# Patient Record
Sex: Male | Born: 1935 | Race: White | Hispanic: No | Marital: Married | Smoking: Never smoker
Health system: Southern US, Community
[De-identification: ages and names within clinical notes are randomized; demographics above are authoritative.]

## PROBLEM LIST (undated history)

## (undated) DIAGNOSIS — K759 Inflammatory liver disease, unspecified: Secondary | ICD-10-CM

## (undated) DIAGNOSIS — E039 Hypothyroidism, unspecified: Secondary | ICD-10-CM

## (undated) DIAGNOSIS — E785 Hyperlipidemia, unspecified: Secondary | ICD-10-CM

## (undated) DIAGNOSIS — E119 Type 2 diabetes mellitus without complications: Secondary | ICD-10-CM

## (undated) DIAGNOSIS — G459 Transient cerebral ischemic attack, unspecified: Secondary | ICD-10-CM

## (undated) DIAGNOSIS — K219 Gastro-esophageal reflux disease without esophagitis: Secondary | ICD-10-CM

## (undated) DIAGNOSIS — K635 Polyp of colon: Secondary | ICD-10-CM

## (undated) DIAGNOSIS — I10 Essential (primary) hypertension: Secondary | ICD-10-CM

## (undated) DIAGNOSIS — I6529 Occlusion and stenosis of unspecified carotid artery: Secondary | ICD-10-CM

## (undated) DIAGNOSIS — M1990 Unspecified osteoarthritis, unspecified site: Secondary | ICD-10-CM

## (undated) HISTORY — DX: Type 2 diabetes mellitus without complications: E11.9

## (undated) HISTORY — DX: Unspecified osteoarthritis, unspecified site: M19.90

## (undated) HISTORY — DX: Hypothyroidism, unspecified: E03.9

## (undated) HISTORY — DX: Gastro-esophageal reflux disease without esophagitis: K21.9

## (undated) HISTORY — DX: Essential (primary) hypertension: I10

## (undated) HISTORY — DX: Occlusion and stenosis of unspecified carotid artery: I65.29

## (undated) HISTORY — DX: Polyp of colon: K63.5

## (undated) HISTORY — DX: Transient cerebral ischemic attack, unspecified: G45.9

## (undated) HISTORY — DX: Hyperlipidemia, unspecified: E78.5

---

## 1978-08-03 DIAGNOSIS — G459 Transient cerebral ischemic attack, unspecified: Secondary | ICD-10-CM

## 1978-08-03 HISTORY — DX: Transient cerebral ischemic attack, unspecified: G45.9

## 1998-02-18 ENCOUNTER — Ambulatory Visit (HOSPITAL_COMMUNITY): Admission: RE | Admit: 1998-02-18 | Discharge: 1998-02-19 | Payer: Self-pay | Admitting: Surgery

## 1999-12-03 ENCOUNTER — Encounter: Payer: Self-pay | Admitting: Emergency Medicine

## 1999-12-03 ENCOUNTER — Emergency Department (HOSPITAL_COMMUNITY): Admission: EM | Admit: 1999-12-03 | Discharge: 1999-12-03 | Payer: Self-pay | Admitting: Emergency Medicine

## 1999-12-07 ENCOUNTER — Emergency Department (HOSPITAL_COMMUNITY): Admission: EM | Admit: 1999-12-07 | Discharge: 1999-12-07 | Payer: Self-pay | Admitting: Emergency Medicine

## 2004-10-15 ENCOUNTER — Ambulatory Visit: Payer: Self-pay | Admitting: Internal Medicine

## 2004-11-04 ENCOUNTER — Ambulatory Visit: Payer: Self-pay | Admitting: Internal Medicine

## 2006-08-03 HISTORY — PX: THYROIDECTOMY: SHX17

## 2009-10-07 ENCOUNTER — Encounter (INDEPENDENT_AMBULATORY_CARE_PROVIDER_SITE_OTHER): Payer: Self-pay | Admitting: *Deleted

## 2010-01-20 ENCOUNTER — Encounter (INDEPENDENT_AMBULATORY_CARE_PROVIDER_SITE_OTHER): Payer: Self-pay | Admitting: *Deleted

## 2010-01-23 ENCOUNTER — Ambulatory Visit: Payer: Self-pay | Admitting: Internal Medicine

## 2010-02-06 ENCOUNTER — Ambulatory Visit: Payer: Self-pay | Admitting: Internal Medicine

## 2010-02-07 ENCOUNTER — Encounter: Payer: Self-pay | Admitting: Internal Medicine

## 2010-09-04 NOTE — Miscellaneous (Signed)
Summary: [facility] Previsit/prep  Clinical Lists Changes  Medications: Added new medication of MOVIPREP 100 GM  SOLR (PEG-KCL-NACL-NASULF-NA ASC-C) As per prep instructions. - Signed Rx of MOVIPREP 100 GM  SOLR (PEG-KCL-NACL-NASULF-NA ASC-C) As per prep instructions.;  #1 x 0;  Signed;  Entered by: Wyona Almas RN;  Authorized by: Hilarie Fredrickson MD;  Method used: Electronically to [facility] #[id number]*, [address] [place], [place], [place]  [address], Ph: [phone], Fax: [phone] Observations: Added new observation of NKA: T (01/23/2010 10:52)    Prescriptions: MOVIPREP 100 GM  SOLR (PEG-KCL-NACL-NASULF-NA ASC-C) As per prep instructions.  #1 x 0   Entered by:   Wyona Almas RN   Authorized by:   Hilarie Fredrickson MD   Signed by:   Wyona Almas RN on 01/23/2010   Method used:   Electronically to        [facility]. [phone]* (retail)       [address]       [place], [place]  [address]       Ph: [phone]       Fax: [phone]   RxID:   [id number]

## 2010-09-04 NOTE — Letter (Signed)
Summary: Colonoscopy Letter  [facility]  [address] [place], [place] [address]   Phone: [phone]  Fax: [phone]      October 07, 2009 MRN: [id number]   [facility] [place] [address] [place], [place]  [address]   Dear Mr. Joerger,   According to your medical record, it is time for you to schedule a Colonoscopy. The American Cancer Society recommends this procedure as a method to detect early colon cancer. Patients with a family history of colon cancer, or a personal history of colon polyps or inflammatory bowel disease are at increased risk.  This letter has been generated based on the recommendations made at the time of your procedure. If you feel that in your particular situation this may no longer apply, please contact our office.  Please call our office at [phone] to schedule this appointment or to update your records at your earliest convenience.  Thank you for cooperating with [place] to provide you with the very best care possible.   Sincerely,  Wilhemina Bonito. Marina Goodell, M.D.  [facility] [phone]

## 2010-09-04 NOTE — Letter (Signed)
Summary: [facility] Instructions  [facility]  [address] [place], [place] [address]   Phone: [phone]  Fax: [phone]       Nathaniel Mcclain    12/30/1935    MRN: [id number]        Procedure Day /Date: 02/06/10 Thursday       Arrival Time: 8:00 am      Procedure Time: 9:00 am     Location of Procedure:                      [facility] (4th Floor)                        PREPARATION FOR COLONOSCOPY WITH MOVIPREP   Starting 5 days prior to your procedure  02/01/2010  do not eat nuts, seeds, popcorn, corn, beans, peas,  salads, or any raw vegetables.  Do not take any fiber supplements (e.g. Metamucil, Citrucel, and Benefiber).  THE DAY BEFORE YOUR PROCEDURE         DATE:   02/05/2010   DAY:  Wednesday  1.  Drink clear liquids the entire day-NO SOLID FOOD  2.  Do not drink anything colored red or purple.  Avoid juices with pulp.  No orange juice.  3.  Drink at least 64 oz. (8 glasses) of fluid/clear liquids during the day to prevent dehydration and help the prep work efficiently.  CLEAR LIQUIDS INCLUDE: Water Jello Ice Popsicles Tea (sugar ok, no milk/cream) Powdered fruit flavored drinks Coffee (sugar ok, no milk/cream) Gatorade Juice: apple, white grape, white cranberry  Lemonade Clear bullion, consomm, broth Carbonated beverages (any kind) Strained chicken noodle soup Hard Candy                             4.  In the morning, mix first dose of MoviPrep solution:    Empty 1 Pouch A and 1 Pouch B into the disposable container    Add lukewarm drinking water to the top line of the container. Mix to dissolve    Refrigerate (mixed solution should be used within 24 hrs)  5.  Begin drinking the prep at 5:00 p.m. The MoviPrep container is divided by 4 marks.   Every 15 minutes drink the solution down to the next mark (approximately 8 oz) until the full liter is complete.   6.  Follow completed prep with 16 oz of clear liquid of your choice  (Nothing red or purple).  Continue to drink clear liquids until bedtime.  7.  Before going to bed, mix second dose of MoviPrep solution:    Empty 1 Pouch A and 1 Pouch B into the disposable container    Add lukewarm drinking water to the top line of the container. Mix to dissolve    Refrigerate  THE DAY OF YOUR PROCEDURE      DATE:   02/06/2010  DAY:  Thursday  Beginning at  4:00 a.m. (5 hours before procedure):         1. Every 15 minutes, drink the solution down to the next mark (approx 8 oz) until the full liter is complete.  2. Follow completed prep with 16 oz. of clear liquid of your choice.    3. You may drink clear liquids until   7:00 am (2 HOURS BEFORE PROCEDURE).   MEDICATION INSTRUCTIONS  Unless otherwise instructed, you should take regular prescription medications  with a small sip of water   as early as possible the morning of your procedure.          OTHER INSTRUCTIONS  You will need a responsible adult at least 73 years of age to accompany you and drive you home.   This person must remain in the waiting room during your procedure.  Wear loose fitting clothing that is easily removed.  Leave jewelry and other valuables at home.  However, you may wish to bring a book to read or  an iPod/MP3 player to listen to music as you wait for your procedure to start.  Remove all body piercing jewelry and leave at home.  Total time from sign-in until discharge is approximately 2-3 hours.  You should go home directly after your procedure and rest.  You can resume normal activities the  day after your procedure.  The day of your procedure you should not:   Drive   Make legal decisions   Operate machinery   Drink alcohol   Return to work  You will receive specific instructions about eating, activities and medications before you leave.    The above instructions have been reviewed and explained to me by   Wyona Almas RN  January 23, 2010 11:39 AM     I  fully understand and can verbalize these instructions _____________________________ Date _________

## 2010-09-04 NOTE — Procedures (Signed)
Summary: Colonoscopy  Patient: Nathaniel Mcclain Note: All result statuses are Final unless otherwise noted.  Tests: (1) Colonoscopy (COL)   COL Colonoscopy           DONE     [facility]     [address]     [place], [place]  [address]           COLONOSCOPY PROCEDURE REPORT           PATIENT:  Nathaniel Mcclain, Nathaniel Mcclain  MR#:  [id number]     BIRTHDATE:  [birth year], 74 yrs. old  GENDER:  male     ENDOSCOPIST:  Wilhemina Bonito. Eda Keys, MD     REF. BY:  Surveillance Program Recall,     PROCEDURE DATE:  02/06/2010     PROCEDURE:  Colonoscopy with snare polypectomy x 2     ASA CLASS:  Class II     INDICATIONS:  history of pre-cancerous (adenomatous) colon polyps,     surveillance and high-risk screening ; index 2002 w/ hp and fair     prep; 2006 w/ TA     MEDICATIONS:   Fentanyl 75 mcg IV, Versed 7 mg IV           DESCRIPTION OF PROCEDURE:   After the risks benefits and     alternatives of the procedure were thoroughly explained, informed     consent was obtained.  Digital rectal exam was performed and     revealed no abnormalities.   The Pentax Ped Colon [id number]     endoscope was introduced through the anus and advanced to the     cecum, which was identified by both the appendix and ileocecal     valve, without limitations.time to cecum =5:23 min.  The quality     of the prep was excellent, using MoviPrep.  The instrument was     then slowly withdrawn (time = 14:01 min) as the colon was fully     examined.     <<PROCEDUREIMAGES>>           FINDINGS:  Two polyps were found - 2mm in the transverse colon and     4mm in rectum. Polyps were snared without cautery. Retrieval was     successful.   Severe diverticulosis was found throughout the colon     w/ significant sigmoid stenosis..   Retroflexed views in the     rectum revealed internal hemorrhoids.    The scope was then     withdrawn from the patient and the procedure completed.           COMPLICATIONS:  None     ENDOSCOPIC IMPRESSION:     1)  Two polyps - removed     2) Severe diverticulosis throughout the colon with sigmoid     stenosis     3) Internal hemorrhoids           RECOMMENDATIONS:     1) Follow up colonoscopy in 5 years           ______________________________     Wilhemina Bonito. Eda Keys, MD           CC:  Geoffry Paradise, MD; The Patient           n.     eSIGNED:   Wilhemina Bonito. Eda Keys at 02/06/2010 09:57 AM           [place]ne, Peyton Najjar, [id number]  Note: An exclamation mark (!) indicates a result that was  not dispersed into the flowsheet. Document Creation Date: 02/06/2010 9:58 AM _______________________________________________________________________  (1) Order result status: Final Collection or observation date-time: 02/06/2010 09:50 Requested date-time:  Receipt date-time:  Reported date-time:  Referring Physician:   Ordering Physician: Fransico Setters [phone]) Specimen Source:  Source: Launa Grill Order Number: [id number] Lab site:   Appended Document: Colonoscopy recall 5 yrs/02-2015/aw     Procedures Next Due Date:    Colonoscopy: 02/2015

## 2010-09-04 NOTE — Letter (Signed)
Summary: Patient Notice- Polyp Results  [name]  [address] [place], [place] [address]   Phone: [phone]  Fax: [phone]        February 07, 2010 MRN: [id number]    [facility] [place] [address] [place], [place]  [address]    Dear Nathaniel Mcclain,  I am pleased to inform you that the colon polyp(s) removed during your recent colonoscopy was (were) found to be benign (no cancer detected) upon pathologic examination.  I recommend you have a repeat colonoscopy examination in 5 years to look for recurrent polyps, as having colon polyps increases your risk for having recurrent polyps or even colon cancer in the future.  Should you develop new or worsening symptoms of abdominal pain, bowel habit changes or bleeding from the rectum or bowels, please schedule an evaluation with either your primary care physician or with me.   Additional information/recommendations:  __ No further action with gastroenterology is needed at this time. Please      follow-up with your primary care physician for your other healthcare      needs.   Please call us if you are having persistent problems or have questions about your condition that have not been fully answered at this time.  Sincerely,  Hilarie Fredrickson MD  This letter has been electronically signed by your physician.  Appended Document: Patient Notice- Polyp Results letter mailed

## 2011-12-11 DIAGNOSIS — E785 Hyperlipidemia, unspecified: Secondary | ICD-10-CM | POA: Diagnosis not present

## 2011-12-11 DIAGNOSIS — E039 Hypothyroidism, unspecified: Secondary | ICD-10-CM | POA: Diagnosis not present

## 2011-12-11 DIAGNOSIS — R82998 Other abnormal findings in urine: Secondary | ICD-10-CM | POA: Diagnosis not present

## 2011-12-11 DIAGNOSIS — Z125 Encounter for screening for malignant neoplasm of prostate: Secondary | ICD-10-CM | POA: Diagnosis not present

## 2011-12-11 DIAGNOSIS — I10 Essential (primary) hypertension: Secondary | ICD-10-CM | POA: Diagnosis not present

## 2011-12-18 DIAGNOSIS — Z0000 Encounter for general adult medical examination without abnormal findings: Secondary | ICD-10-CM | POA: Diagnosis not present

## 2011-12-18 DIAGNOSIS — I10 Essential (primary) hypertension: Secondary | ICD-10-CM | POA: Diagnosis not present

## 2011-12-18 DIAGNOSIS — M1990 Unspecified osteoarthritis, unspecified site: Secondary | ICD-10-CM | POA: Diagnosis not present

## 2011-12-18 DIAGNOSIS — E039 Hypothyroidism, unspecified: Secondary | ICD-10-CM | POA: Diagnosis not present

## 2011-12-18 DIAGNOSIS — Z125 Encounter for screening for malignant neoplasm of prostate: Secondary | ICD-10-CM | POA: Diagnosis not present

## 2011-12-21 DIAGNOSIS — Z1212 Encounter for screening for malignant neoplasm of rectum: Secondary | ICD-10-CM | POA: Diagnosis not present

## 2012-06-29 DIAGNOSIS — I10 Essential (primary) hypertension: Secondary | ICD-10-CM | POA: Diagnosis not present

## 2012-06-29 DIAGNOSIS — E785 Hyperlipidemia, unspecified: Secondary | ICD-10-CM | POA: Diagnosis not present

## 2012-06-29 DIAGNOSIS — Z23 Encounter for immunization: Secondary | ICD-10-CM | POA: Diagnosis not present

## 2012-06-29 DIAGNOSIS — E039 Hypothyroidism, unspecified: Secondary | ICD-10-CM | POA: Diagnosis not present

## 2012-12-12 DIAGNOSIS — H2510 Age-related nuclear cataract, unspecified eye: Secondary | ICD-10-CM | POA: Diagnosis not present

## 2012-12-12 DIAGNOSIS — H40019 Open angle with borderline findings, low risk, unspecified eye: Secondary | ICD-10-CM | POA: Diagnosis not present

## 2012-12-12 DIAGNOSIS — T1500XA Foreign body in cornea, unspecified eye, initial encounter: Secondary | ICD-10-CM | POA: Diagnosis not present

## 2012-12-12 DIAGNOSIS — H01009 Unspecified blepharitis unspecified eye, unspecified eyelid: Secondary | ICD-10-CM | POA: Diagnosis not present

## 2012-12-19 DIAGNOSIS — E785 Hyperlipidemia, unspecified: Secondary | ICD-10-CM | POA: Diagnosis not present

## 2012-12-19 DIAGNOSIS — Z125 Encounter for screening for malignant neoplasm of prostate: Secondary | ICD-10-CM | POA: Diagnosis not present

## 2012-12-19 DIAGNOSIS — R82998 Other abnormal findings in urine: Secondary | ICD-10-CM | POA: Diagnosis not present

## 2012-12-19 DIAGNOSIS — I10 Essential (primary) hypertension: Secondary | ICD-10-CM | POA: Diagnosis not present

## 2012-12-19 DIAGNOSIS — E039 Hypothyroidism, unspecified: Secondary | ICD-10-CM | POA: Diagnosis not present

## 2013-01-04 DIAGNOSIS — Z1212 Encounter for screening for malignant neoplasm of rectum: Secondary | ICD-10-CM | POA: Diagnosis not present

## 2013-01-12 DIAGNOSIS — H2510 Age-related nuclear cataract, unspecified eye: Secondary | ICD-10-CM | POA: Diagnosis not present

## 2013-01-12 DIAGNOSIS — H40019 Open angle with borderline findings, low risk, unspecified eye: Secondary | ICD-10-CM | POA: Diagnosis not present

## 2013-01-12 DIAGNOSIS — H04129 Dry eye syndrome of unspecified lacrimal gland: Secondary | ICD-10-CM | POA: Diagnosis not present

## 2013-01-12 DIAGNOSIS — H02839 Dermatochalasis of unspecified eye, unspecified eyelid: Secondary | ICD-10-CM | POA: Diagnosis not present

## 2013-01-23 DIAGNOSIS — Z23 Encounter for immunization: Secondary | ICD-10-CM | POA: Diagnosis not present

## 2013-01-23 DIAGNOSIS — Z6825 Body mass index (BMI) 25.0-25.9, adult: Secondary | ICD-10-CM | POA: Diagnosis not present

## 2013-01-23 DIAGNOSIS — R011 Cardiac murmur, unspecified: Secondary | ICD-10-CM | POA: Diagnosis not present

## 2013-01-23 DIAGNOSIS — E039 Hypothyroidism, unspecified: Secondary | ICD-10-CM | POA: Diagnosis not present

## 2013-01-23 DIAGNOSIS — E785 Hyperlipidemia, unspecified: Secondary | ICD-10-CM | POA: Diagnosis not present

## 2013-01-23 DIAGNOSIS — Z1331 Encounter for screening for depression: Secondary | ICD-10-CM | POA: Diagnosis not present

## 2013-01-23 DIAGNOSIS — I10 Essential (primary) hypertension: Secondary | ICD-10-CM | POA: Diagnosis not present

## 2013-01-23 DIAGNOSIS — M1990 Unspecified osteoarthritis, unspecified site: Secondary | ICD-10-CM | POA: Diagnosis not present

## 2013-01-23 DIAGNOSIS — Z125 Encounter for screening for malignant neoplasm of prostate: Secondary | ICD-10-CM | POA: Diagnosis not present

## 2013-01-23 DIAGNOSIS — Z0000 Encounter for general adult medical examination without abnormal findings: Secondary | ICD-10-CM | POA: Diagnosis not present

## 2013-02-01 ENCOUNTER — Other Ambulatory Visit (INDEPENDENT_AMBULATORY_CARE_PROVIDER_SITE_OTHER): Payer: Medicare Other | Admitting: Vascular Surgery

## 2013-02-01 ENCOUNTER — Encounter: Payer: Self-pay | Admitting: Internal Medicine

## 2013-02-01 DIAGNOSIS — I10 Essential (primary) hypertension: Secondary | ICD-10-CM | POA: Diagnosis not present

## 2013-02-01 DIAGNOSIS — E785 Hyperlipidemia, unspecified: Secondary | ICD-10-CM | POA: Diagnosis not present

## 2013-02-01 DIAGNOSIS — E039 Hypothyroidism, unspecified: Secondary | ICD-10-CM | POA: Diagnosis not present

## 2013-02-01 DIAGNOSIS — R0989 Other specified symptoms and signs involving the circulatory and respiratory systems: Secondary | ICD-10-CM | POA: Diagnosis not present

## 2013-02-01 DIAGNOSIS — R011 Cardiac murmur, unspecified: Secondary | ICD-10-CM | POA: Diagnosis not present

## 2013-02-01 DIAGNOSIS — K219 Gastro-esophageal reflux disease without esophagitis: Secondary | ICD-10-CM | POA: Diagnosis not present

## 2013-02-01 DIAGNOSIS — I359 Nonrheumatic aortic valve disorder, unspecified: Secondary | ICD-10-CM | POA: Diagnosis not present

## 2013-02-01 DIAGNOSIS — Z8673 Personal history of transient ischemic attack (TIA), and cerebral infarction without residual deficits: Secondary | ICD-10-CM | POA: Diagnosis not present

## 2013-06-15 DIAGNOSIS — L819 Disorder of pigmentation, unspecified: Secondary | ICD-10-CM | POA: Diagnosis not present

## 2013-06-15 DIAGNOSIS — L821 Other seborrheic keratosis: Secondary | ICD-10-CM | POA: Diagnosis not present

## 2013-06-15 DIAGNOSIS — L570 Actinic keratosis: Secondary | ICD-10-CM | POA: Diagnosis not present

## 2013-06-15 DIAGNOSIS — D0460 Carcinoma in situ of skin of unspecified upper limb, including shoulder: Secondary | ICD-10-CM | POA: Diagnosis not present

## 2013-06-15 DIAGNOSIS — D485 Neoplasm of uncertain behavior of skin: Secondary | ICD-10-CM | POA: Diagnosis not present

## 2013-09-21 DIAGNOSIS — M1990 Unspecified osteoarthritis, unspecified site: Secondary | ICD-10-CM | POA: Diagnosis not present

## 2013-09-21 DIAGNOSIS — E785 Hyperlipidemia, unspecified: Secondary | ICD-10-CM | POA: Diagnosis not present

## 2013-09-21 DIAGNOSIS — E039 Hypothyroidism, unspecified: Secondary | ICD-10-CM | POA: Diagnosis not present

## 2013-09-21 DIAGNOSIS — I10 Essential (primary) hypertension: Secondary | ICD-10-CM | POA: Diagnosis not present

## 2013-09-21 DIAGNOSIS — Z6826 Body mass index (BMI) 26.0-26.9, adult: Secondary | ICD-10-CM | POA: Diagnosis not present

## 2013-12-13 DIAGNOSIS — D2370 Other benign neoplasm of skin of unspecified lower limb, including hip: Secondary | ICD-10-CM | POA: Diagnosis not present

## 2013-12-13 DIAGNOSIS — D1801 Hemangioma of skin and subcutaneous tissue: Secondary | ICD-10-CM | POA: Diagnosis not present

## 2013-12-13 DIAGNOSIS — Z85828 Personal history of other malignant neoplasm of skin: Secondary | ICD-10-CM | POA: Diagnosis not present

## 2013-12-13 DIAGNOSIS — L821 Other seborrheic keratosis: Secondary | ICD-10-CM | POA: Diagnosis not present

## 2013-12-13 DIAGNOSIS — D239 Other benign neoplasm of skin, unspecified: Secondary | ICD-10-CM | POA: Diagnosis not present

## 2013-12-13 DIAGNOSIS — L570 Actinic keratosis: Secondary | ICD-10-CM | POA: Diagnosis not present

## 2014-02-07 DIAGNOSIS — R82998 Other abnormal findings in urine: Secondary | ICD-10-CM | POA: Diagnosis not present

## 2014-02-07 DIAGNOSIS — E785 Hyperlipidemia, unspecified: Secondary | ICD-10-CM | POA: Diagnosis not present

## 2014-02-07 DIAGNOSIS — Z125 Encounter for screening for malignant neoplasm of prostate: Secondary | ICD-10-CM | POA: Diagnosis not present

## 2014-02-07 DIAGNOSIS — I10 Essential (primary) hypertension: Secondary | ICD-10-CM | POA: Diagnosis not present

## 2014-02-07 DIAGNOSIS — E039 Hypothyroidism, unspecified: Secondary | ICD-10-CM | POA: Diagnosis not present

## 2014-02-12 DIAGNOSIS — E785 Hyperlipidemia, unspecified: Secondary | ICD-10-CM | POA: Diagnosis not present

## 2014-02-12 DIAGNOSIS — M1990 Unspecified osteoarthritis, unspecified site: Secondary | ICD-10-CM | POA: Diagnosis not present

## 2014-02-12 DIAGNOSIS — Z125 Encounter for screening for malignant neoplasm of prostate: Secondary | ICD-10-CM | POA: Diagnosis not present

## 2014-02-12 DIAGNOSIS — I10 Essential (primary) hypertension: Secondary | ICD-10-CM | POA: Diagnosis not present

## 2014-02-12 DIAGNOSIS — Z0000 Encounter for general adult medical examination without abnormal findings: Secondary | ICD-10-CM | POA: Diagnosis not present

## 2014-02-12 DIAGNOSIS — Z1331 Encounter for screening for depression: Secondary | ICD-10-CM | POA: Diagnosis not present

## 2014-02-12 DIAGNOSIS — E039 Hypothyroidism, unspecified: Secondary | ICD-10-CM | POA: Diagnosis not present

## 2014-02-12 DIAGNOSIS — Z6826 Body mass index (BMI) 26.0-26.9, adult: Secondary | ICD-10-CM | POA: Diagnosis not present

## 2014-02-13 DIAGNOSIS — Z1212 Encounter for screening for malignant neoplasm of rectum: Secondary | ICD-10-CM | POA: Diagnosis not present

## 2014-05-14 ENCOUNTER — Encounter: Payer: Self-pay | Admitting: Internal Medicine

## 2014-05-18 DIAGNOSIS — Z23 Encounter for immunization: Secondary | ICD-10-CM | POA: Diagnosis not present

## 2014-08-13 DIAGNOSIS — I10 Essential (primary) hypertension: Secondary | ICD-10-CM | POA: Diagnosis not present

## 2014-08-13 DIAGNOSIS — Z6826 Body mass index (BMI) 26.0-26.9, adult: Secondary | ICD-10-CM | POA: Diagnosis not present

## 2014-08-13 DIAGNOSIS — M1990 Unspecified osteoarthritis, unspecified site: Secondary | ICD-10-CM | POA: Diagnosis not present

## 2014-08-13 DIAGNOSIS — K219 Gastro-esophageal reflux disease without esophagitis: Secondary | ICD-10-CM | POA: Diagnosis not present

## 2014-08-13 DIAGNOSIS — E039 Hypothyroidism, unspecified: Secondary | ICD-10-CM | POA: Diagnosis not present

## 2014-08-13 DIAGNOSIS — E785 Hyperlipidemia, unspecified: Secondary | ICD-10-CM | POA: Diagnosis not present

## 2014-12-28 DIAGNOSIS — L821 Other seborrheic keratosis: Secondary | ICD-10-CM | POA: Diagnosis not present

## 2014-12-28 DIAGNOSIS — L570 Actinic keratosis: Secondary | ICD-10-CM | POA: Diagnosis not present

## 2014-12-28 DIAGNOSIS — L237 Allergic contact dermatitis due to plants, except food: Secondary | ICD-10-CM | POA: Diagnosis not present

## 2014-12-28 DIAGNOSIS — D224 Melanocytic nevi of scalp and neck: Secondary | ICD-10-CM | POA: Diagnosis not present

## 2014-12-28 DIAGNOSIS — D2371 Other benign neoplasm of skin of right lower limb, including hip: Secondary | ICD-10-CM | POA: Diagnosis not present

## 2015-01-03 ENCOUNTER — Encounter: Payer: Self-pay | Admitting: Internal Medicine

## 2015-02-11 DIAGNOSIS — E039 Hypothyroidism, unspecified: Secondary | ICD-10-CM | POA: Diagnosis not present

## 2015-02-11 DIAGNOSIS — I10 Essential (primary) hypertension: Secondary | ICD-10-CM | POA: Diagnosis not present

## 2015-02-11 DIAGNOSIS — Z125 Encounter for screening for malignant neoplasm of prostate: Secondary | ICD-10-CM | POA: Diagnosis not present

## 2015-02-11 DIAGNOSIS — N390 Urinary tract infection, site not specified: Secondary | ICD-10-CM | POA: Diagnosis not present

## 2015-02-11 DIAGNOSIS — E785 Hyperlipidemia, unspecified: Secondary | ICD-10-CM | POA: Diagnosis not present

## 2015-02-11 DIAGNOSIS — R8290 Unspecified abnormal findings in urine: Secondary | ICD-10-CM | POA: Diagnosis not present

## 2015-02-15 DIAGNOSIS — E039 Hypothyroidism, unspecified: Secondary | ICD-10-CM | POA: Diagnosis not present

## 2015-02-15 DIAGNOSIS — E785 Hyperlipidemia, unspecified: Secondary | ICD-10-CM | POA: Diagnosis not present

## 2015-02-15 DIAGNOSIS — Z6825 Body mass index (BMI) 25.0-25.9, adult: Secondary | ICD-10-CM | POA: Diagnosis not present

## 2015-02-15 DIAGNOSIS — I10 Essential (primary) hypertension: Secondary | ICD-10-CM | POA: Diagnosis not present

## 2015-02-15 DIAGNOSIS — Z1389 Encounter for screening for other disorder: Secondary | ICD-10-CM | POA: Diagnosis not present

## 2015-02-15 DIAGNOSIS — M1990 Unspecified osteoarthritis, unspecified site: Secondary | ICD-10-CM | POA: Diagnosis not present

## 2015-02-15 DIAGNOSIS — Z0000 Encounter for general adult medical examination without abnormal findings: Secondary | ICD-10-CM | POA: Diagnosis not present

## 2015-02-15 DIAGNOSIS — Z23 Encounter for immunization: Secondary | ICD-10-CM | POA: Diagnosis not present

## 2015-02-15 DIAGNOSIS — K219 Gastro-esophageal reflux disease without esophagitis: Secondary | ICD-10-CM | POA: Diagnosis not present

## 2015-02-26 ENCOUNTER — Encounter: Payer: Self-pay | Admitting: Internal Medicine

## 2015-04-26 ENCOUNTER — Ambulatory Visit (AMBULATORY_SURGERY_CENTER): Payer: Self-pay | Admitting: *Deleted

## 2015-04-26 VITALS — Ht 72.0 in | Wt 191.8 lb

## 2015-04-26 DIAGNOSIS — Z86010 Personal history of colonic polyps: Secondary | ICD-10-CM

## 2015-04-26 NOTE — Progress Notes (Signed)
Denies allergies to eggs or soy products. Denies complications with sedation or anesthesia. Denies O2 use. Denies use of diet or weight loss medications.  Emmi instructions not given for colonoscopy, no access to internet.

## 2015-05-10 ENCOUNTER — Ambulatory Visit (AMBULATORY_SURGERY_CENTER): Payer: Medicare Other | Admitting: Internal Medicine

## 2015-05-10 ENCOUNTER — Encounter: Payer: Self-pay | Admitting: Internal Medicine

## 2015-05-10 VITALS — BP 138/63 | HR 52 | Temp 96.1°F | Resp 16 | Ht 72.0 in | Wt 191.0 lb

## 2015-05-10 DIAGNOSIS — E669 Obesity, unspecified: Secondary | ICD-10-CM | POA: Diagnosis not present

## 2015-05-10 DIAGNOSIS — I10 Essential (primary) hypertension: Secondary | ICD-10-CM | POA: Diagnosis not present

## 2015-05-10 DIAGNOSIS — D122 Benign neoplasm of ascending colon: Secondary | ICD-10-CM | POA: Diagnosis not present

## 2015-05-10 DIAGNOSIS — Z86010 Personal history of colonic polyps: Secondary | ICD-10-CM

## 2015-05-10 DIAGNOSIS — Z8673 Personal history of transient ischemic attack (TIA), and cerebral infarction without residual deficits: Secondary | ICD-10-CM | POA: Diagnosis not present

## 2015-05-10 DIAGNOSIS — D120 Benign neoplasm of cecum: Secondary | ICD-10-CM | POA: Diagnosis not present

## 2015-05-10 DIAGNOSIS — K219 Gastro-esophageal reflux disease without esophagitis: Secondary | ICD-10-CM | POA: Diagnosis not present

## 2015-05-10 DIAGNOSIS — E039 Hypothyroidism, unspecified: Secondary | ICD-10-CM | POA: Diagnosis not present

## 2015-05-10 MED ORDER — SODIUM CHLORIDE 0.9 % IV SOLN: 500.0000 mL | INTRAVENOUS | Status: DC

## 2015-05-10 NOTE — Progress Notes (Signed)
Report to PACU, RN, vss, BBS= Clear.  

## 2015-05-10 NOTE — Op Note (Signed)
[facility]  [address] [place], [address]   COLONOSCOPY PROCEDURE REPORT  PATIENT: Nathaniel Mcclain, Nathaniel Mcclain  MR#: [id number] BIRTHDATE: 11/05/1935 , 13  yrs. old GENDER: male ENDOSCOPIST: Eustace Quail, MD REFERRED LR:[id number] Program Recall PROCEDURE DATE:  05/10/2015 PROCEDURE:   Colonoscopy, surveillance and Colonoscopy with snare polypectomy x 2 First Screening Colonoscopy - Avg.  risk and is 50 yrs.  old or older - No.  Prior Negative Screening - Now for repeat screening. N/A  History of Adenoma - Now for follow-up colonoscopy & has been > or = to 3 yrs.  Yes hx of adenoma.  Has been 3 or more years since last colonoscopy.  Polyps removed today? Yes ASA CLASS:   Class II INDICATIONS:Surveillance due to prior colonic neoplasia and PH Colon Adenoma.. Previous colonoscopies 2002, 2006, and 2011 with diminutive adenomas only MEDICATIONS: Monitored anesthesia care and Propofol 200 mg IV  DESCRIPTION OF PROCEDURE:   After the risks benefits and alternatives of the procedure were thoroughly explained, informed consent was obtained.  The digital rectal exam revealed no abnormalities of the rectum.   The [id number]  endoscope was introduced through the anus and advanced to the cecum, which was identified by both the appendix and ileocecal valve. No adverse events experienced.   The quality of the prep was excellent. (Suprep was used)  The instrument was then slowly withdrawn as the colon was fully examined. Estimated blood loss is zero unless otherwise noted in this procedure report.  COLON FINDINGS: Two polyps ranging from 2 to 23mm in size were found in the ascending colon and at the cecum.  A polypectomy was performed with a cold snare.  The resection was complete, the polyp tissue was completely retrieved and sent to histology.   There was moderate diverticulosis noted throughout the entire examined colon. The examination was otherwise normal.   Retroflexed views revealed no abnormalities. The time to cecum = 5.1 Withdrawal time = 11.9 The scope was withdrawn and the procedure completed. COMPLICATIONS: There were no immediate complications.  ENDOSCOPIC IMPRESSION: 1.   Two polyps were found in the ascending colon and at the cecum; polypectomy was performed with a cold snare 2.   Moderate diverticulosis was noted throughout the entire examined colon 3.   The examination was otherwise normal  RECOMMENDATIONS: 1. Return to the care of your primary provider.  GI follow up as needed  eSigned:  Eustace Quail, MD 05/10/2015 11:58 AM   cc: The Patient and Burnard Bunting, MD

## 2015-05-10 NOTE — Progress Notes (Signed)
Called to room to assist during endoscopic procedure.  Patient ID and intended procedure confirmed with present staff. Received instructions for my participation in the procedure from the performing physician.  

## 2015-05-10 NOTE — Patient Instructions (Signed)
YOU HAD AN ENDOSCOPIC PROCEDURE TODAY AT [facility]:   Refer to the procedure report that was given to you for any specific questions about what was found during the examination.  If the procedure report does not answer your questions, please call your gastroenterologist to clarify.  If you requested that your care partner not be given the details of your procedure findings, then the procedure report has been included in a sealed envelope for you to review at your convenience later.  YOU SHOULD EXPECT: Some feelings of bloating in the abdomen. Passage of more gas than usual.  Walking can help get rid of the air that was put into your GI tract during the procedure and reduce the bloating. If you had a lower endoscopy (such as a colonoscopy or flexible sigmoidoscopy) you may notice spotting of blood in your stool or on the toilet paper. If you underwent a bowel prep for your procedure, you may not have a normal bowel movement for a few days.  Please Note:  You might notice some irritation and congestion in your nose or some drainage.  This is from the oxygen used during your procedure.  There is no need for concern and it should clear up in a day or so.  SYMPTOMS TO REPORT IMMEDIATELY:   Following lower endoscopy (colonoscopy or flexible sigmoidoscopy):  Excessive amounts of blood in the stool  Significant tenderness or worsening of abdominal pains  Swelling of the abdomen that is new, acute  Fever of 100F or higher   For urgent or emergent issues, a gastroenterologist can be reached at any hour by calling [phone].   DIET: Your first meal following the procedure should be a small meal and then it is ok to progress to your normal diet. Heavy or fried foods are harder to digest and may make you feel nauseous or bloated.  Likewise, meals heavy in dairy and vegetables can increase bloating.  Drink plenty of fluids but you should avoid alcoholic beverages for 24  hours.  ACTIVITY:  You should plan to take it easy for the rest of today and you should NOT DRIVE or use heavy machinery until tomorrow (because of the sedation medicines used during the test).    FOLLOW UP: Our staff will call the number listed on your records the next business day following your procedure to check on you and address any questions or concerns that you may have regarding the information given to you following your procedure. If we do not reach you, we will leave a message.  However, if you are feeling well and you are not experiencing any problems, there is no need to return our call.  We will assume that you have returned to your regular daily activities without incident.  If any biopsies were taken you will be contacted by phone or by letter within the next 1-3 weeks.  Please call us at [phone] if you have not heard about the biopsies in 3 weeks.    SIGNATURES/CONFIDENTIALITY: You and/or your care partner have signed paperwork which will be entered into your electronic medical record.  These signatures attest to the fact that that the information above on your After Visit Summary has been reviewed and is understood.  Full responsibility of the confidentiality of this discharge information lies with you and/or your care-partner.  Polyps, diverticulosis, high fiber diet-handouts given  Return to primary care and follow-up with GI as needed.

## 2015-05-13 ENCOUNTER — Telehealth: Payer: Self-pay | Admitting: *Deleted

## 2015-05-13 NOTE — Telephone Encounter (Signed)
  Follow up Call-  Call back number 05/10/2015  Post procedure Call Back phone  # [phone]  Permission to leave phone message Yes     Left message to call us back if experiencing problems or has any questions.

## 2015-05-14 ENCOUNTER — Encounter: Payer: Self-pay | Admitting: Internal Medicine

## 2015-08-30 DIAGNOSIS — E784 Other hyperlipidemia: Secondary | ICD-10-CM | POA: Diagnosis not present

## 2015-08-30 DIAGNOSIS — K219 Gastro-esophageal reflux disease without esophagitis: Secondary | ICD-10-CM | POA: Diagnosis not present

## 2015-08-30 DIAGNOSIS — M1990 Unspecified osteoarthritis, unspecified site: Secondary | ICD-10-CM | POA: Diagnosis not present

## 2015-08-30 DIAGNOSIS — Z1389 Encounter for screening for other disorder: Secondary | ICD-10-CM | POA: Diagnosis not present

## 2015-08-30 DIAGNOSIS — I38 Endocarditis, valve unspecified: Secondary | ICD-10-CM | POA: Diagnosis not present

## 2015-08-30 DIAGNOSIS — R946 Abnormal results of thyroid function studies: Secondary | ICD-10-CM | POA: Diagnosis not present

## 2015-08-30 DIAGNOSIS — I10 Essential (primary) hypertension: Secondary | ICD-10-CM | POA: Diagnosis not present

## 2015-08-30 DIAGNOSIS — R0989 Other specified symptoms and signs involving the circulatory and respiratory systems: Secondary | ICD-10-CM | POA: Diagnosis not present

## 2015-08-30 DIAGNOSIS — Z6826 Body mass index (BMI) 26.0-26.9, adult: Secondary | ICD-10-CM | POA: Diagnosis not present

## 2015-08-30 DIAGNOSIS — M5416 Radiculopathy, lumbar region: Secondary | ICD-10-CM | POA: Diagnosis not present

## 2015-08-30 DIAGNOSIS — E038 Other specified hypothyroidism: Secondary | ICD-10-CM | POA: Diagnosis not present

## 2015-10-21 DIAGNOSIS — H25811 Combined forms of age-related cataract, right eye: Secondary | ICD-10-CM | POA: Diagnosis not present

## 2015-10-21 DIAGNOSIS — H25812 Combined forms of age-related cataract, left eye: Secondary | ICD-10-CM | POA: Diagnosis not present

## 2015-10-21 DIAGNOSIS — H04123 Dry eye syndrome of bilateral lacrimal glands: Secondary | ICD-10-CM | POA: Diagnosis not present

## 2015-10-21 DIAGNOSIS — H35372 Puckering of macula, left eye: Secondary | ICD-10-CM | POA: Diagnosis not present

## 2015-10-21 DIAGNOSIS — H02831 Dermatochalasis of right upper eyelid: Secondary | ICD-10-CM | POA: Diagnosis not present

## 2016-01-01 DIAGNOSIS — L570 Actinic keratosis: Secondary | ICD-10-CM | POA: Diagnosis not present

## 2016-01-01 DIAGNOSIS — L814 Other melanin hyperpigmentation: Secondary | ICD-10-CM | POA: Diagnosis not present

## 2016-01-01 DIAGNOSIS — L821 Other seborrheic keratosis: Secondary | ICD-10-CM | POA: Diagnosis not present

## 2016-01-01 DIAGNOSIS — D485 Neoplasm of uncertain behavior of skin: Secondary | ICD-10-CM | POA: Diagnosis not present

## 2016-01-01 DIAGNOSIS — D1801 Hemangioma of skin and subcutaneous tissue: Secondary | ICD-10-CM | POA: Diagnosis not present

## 2016-01-01 DIAGNOSIS — L918 Other hypertrophic disorders of the skin: Secondary | ICD-10-CM | POA: Diagnosis not present

## 2016-02-14 DIAGNOSIS — R8290 Unspecified abnormal findings in urine: Secondary | ICD-10-CM | POA: Diagnosis not present

## 2016-02-14 DIAGNOSIS — Z125 Encounter for screening for malignant neoplasm of prostate: Secondary | ICD-10-CM | POA: Diagnosis not present

## 2016-02-14 DIAGNOSIS — E784 Other hyperlipidemia: Secondary | ICD-10-CM | POA: Diagnosis not present

## 2016-02-14 DIAGNOSIS — E038 Other specified hypothyroidism: Secondary | ICD-10-CM | POA: Diagnosis not present

## 2016-02-14 DIAGNOSIS — N390 Urinary tract infection, site not specified: Secondary | ICD-10-CM | POA: Diagnosis not present

## 2016-02-14 DIAGNOSIS — I10 Essential (primary) hypertension: Secondary | ICD-10-CM | POA: Diagnosis not present

## 2016-02-28 DIAGNOSIS — Z1389 Encounter for screening for other disorder: Secondary | ICD-10-CM | POA: Diagnosis not present

## 2016-02-28 DIAGNOSIS — E784 Other hyperlipidemia: Secondary | ICD-10-CM | POA: Diagnosis not present

## 2016-02-28 DIAGNOSIS — R946 Abnormal results of thyroid function studies: Secondary | ICD-10-CM | POA: Diagnosis not present

## 2016-02-28 DIAGNOSIS — M1990 Unspecified osteoarthritis, unspecified site: Secondary | ICD-10-CM | POA: Diagnosis not present

## 2016-02-28 DIAGNOSIS — E039 Hypothyroidism, unspecified: Secondary | ICD-10-CM | POA: Diagnosis not present

## 2016-02-28 DIAGNOSIS — R7301 Impaired fasting glucose: Secondary | ICD-10-CM | POA: Diagnosis not present

## 2016-02-28 DIAGNOSIS — M5416 Radiculopathy, lumbar region: Secondary | ICD-10-CM | POA: Diagnosis not present

## 2016-02-28 DIAGNOSIS — Z6826 Body mass index (BMI) 26.0-26.9, adult: Secondary | ICD-10-CM | POA: Diagnosis not present

## 2016-02-28 DIAGNOSIS — I38 Endocarditis, valve unspecified: Secondary | ICD-10-CM | POA: Diagnosis not present

## 2016-02-28 DIAGNOSIS — Z1212 Encounter for screening for malignant neoplasm of rectum: Secondary | ICD-10-CM | POA: Diagnosis not present

## 2016-02-28 DIAGNOSIS — Z0000 Encounter for general adult medical examination without abnormal findings: Secondary | ICD-10-CM | POA: Diagnosis not present

## 2016-02-28 DIAGNOSIS — I10 Essential (primary) hypertension: Secondary | ICD-10-CM | POA: Diagnosis not present

## 2016-06-13 DIAGNOSIS — Z23 Encounter for immunization: Secondary | ICD-10-CM | POA: Diagnosis not present

## 2016-09-02 DIAGNOSIS — I38 Endocarditis, valve unspecified: Secondary | ICD-10-CM | POA: Diagnosis not present

## 2016-09-02 DIAGNOSIS — I10 Essential (primary) hypertension: Secondary | ICD-10-CM | POA: Diagnosis not present

## 2016-09-02 DIAGNOSIS — Z23 Encounter for immunization: Secondary | ICD-10-CM | POA: Diagnosis not present

## 2016-09-02 DIAGNOSIS — E784 Other hyperlipidemia: Secondary | ICD-10-CM | POA: Diagnosis not present

## 2016-09-02 DIAGNOSIS — E038 Other specified hypothyroidism: Secondary | ICD-10-CM | POA: Diagnosis not present

## 2016-09-02 DIAGNOSIS — M5416 Radiculopathy, lumbar region: Secondary | ICD-10-CM | POA: Diagnosis not present

## 2016-09-02 DIAGNOSIS — R946 Abnormal results of thyroid function studies: Secondary | ICD-10-CM | POA: Diagnosis not present

## 2016-09-02 DIAGNOSIS — M1990 Unspecified osteoarthritis, unspecified site: Secondary | ICD-10-CM | POA: Diagnosis not present

## 2016-09-02 DIAGNOSIS — Z6825 Body mass index (BMI) 25.0-25.9, adult: Secondary | ICD-10-CM | POA: Diagnosis not present

## 2016-09-02 DIAGNOSIS — R0989 Other specified symptoms and signs involving the circulatory and respiratory systems: Secondary | ICD-10-CM | POA: Diagnosis not present

## 2016-09-02 DIAGNOSIS — Z1389 Encounter for screening for other disorder: Secondary | ICD-10-CM | POA: Diagnosis not present

## 2016-09-02 DIAGNOSIS — K219 Gastro-esophageal reflux disease without esophagitis: Secondary | ICD-10-CM | POA: Diagnosis not present

## 2016-12-07 DIAGNOSIS — L814 Other melanin hyperpigmentation: Secondary | ICD-10-CM | POA: Diagnosis not present

## 2016-12-07 DIAGNOSIS — L570 Actinic keratosis: Secondary | ICD-10-CM | POA: Diagnosis not present

## 2016-12-07 DIAGNOSIS — L821 Other seborrheic keratosis: Secondary | ICD-10-CM | POA: Diagnosis not present

## 2016-12-07 DIAGNOSIS — D2371 Other benign neoplasm of skin of right lower limb, including hip: Secondary | ICD-10-CM | POA: Diagnosis not present

## 2016-12-07 DIAGNOSIS — L918 Other hypertrophic disorders of the skin: Secondary | ICD-10-CM | POA: Diagnosis not present

## 2016-12-07 DIAGNOSIS — Z85828 Personal history of other malignant neoplasm of skin: Secondary | ICD-10-CM | POA: Diagnosis not present

## 2016-12-07 DIAGNOSIS — D225 Melanocytic nevi of trunk: Secondary | ICD-10-CM | POA: Diagnosis not present

## 2016-12-07 DIAGNOSIS — D2261 Melanocytic nevi of right upper limb, including shoulder: Secondary | ICD-10-CM | POA: Diagnosis not present

## 2016-12-07 DIAGNOSIS — C44329 Squamous cell carcinoma of skin of other parts of face: Secondary | ICD-10-CM | POA: Diagnosis not present

## 2016-12-07 DIAGNOSIS — D2262 Melanocytic nevi of left upper limb, including shoulder: Secondary | ICD-10-CM | POA: Diagnosis not present

## 2016-12-07 DIAGNOSIS — D1801 Hemangioma of skin and subcutaneous tissue: Secondary | ICD-10-CM | POA: Diagnosis not present

## 2016-12-22 DIAGNOSIS — Z85828 Personal history of other malignant neoplasm of skin: Secondary | ICD-10-CM | POA: Diagnosis not present

## 2016-12-22 DIAGNOSIS — C44329 Squamous cell carcinoma of skin of other parts of face: Secondary | ICD-10-CM | POA: Diagnosis not present

## 2017-02-12 DIAGNOSIS — Z85828 Personal history of other malignant neoplasm of skin: Secondary | ICD-10-CM | POA: Diagnosis not present

## 2017-02-12 DIAGNOSIS — L820 Inflamed seborrheic keratosis: Secondary | ICD-10-CM | POA: Diagnosis not present

## 2017-03-04 DIAGNOSIS — Z125 Encounter for screening for malignant neoplasm of prostate: Secondary | ICD-10-CM | POA: Diagnosis not present

## 2017-03-04 DIAGNOSIS — I10 Essential (primary) hypertension: Secondary | ICD-10-CM | POA: Diagnosis not present

## 2017-03-04 DIAGNOSIS — R8290 Unspecified abnormal findings in urine: Secondary | ICD-10-CM | POA: Diagnosis not present

## 2017-03-04 DIAGNOSIS — E038 Other specified hypothyroidism: Secondary | ICD-10-CM | POA: Diagnosis not present

## 2017-03-04 DIAGNOSIS — N390 Urinary tract infection, site not specified: Secondary | ICD-10-CM | POA: Diagnosis not present

## 2017-03-04 DIAGNOSIS — R7301 Impaired fasting glucose: Secondary | ICD-10-CM | POA: Diagnosis not present

## 2017-03-08 DIAGNOSIS — Z0000 Encounter for general adult medical examination without abnormal findings: Secondary | ICD-10-CM | POA: Diagnosis not present

## 2017-03-08 DIAGNOSIS — E781 Pure hyperglyceridemia: Secondary | ICD-10-CM | POA: Diagnosis not present

## 2017-03-08 DIAGNOSIS — Z6825 Body mass index (BMI) 25.0-25.9, adult: Secondary | ICD-10-CM | POA: Diagnosis not present

## 2017-03-08 DIAGNOSIS — M5416 Radiculopathy, lumbar region: Secondary | ICD-10-CM | POA: Diagnosis not present

## 2017-03-08 DIAGNOSIS — R0989 Other specified symptoms and signs involving the circulatory and respiratory systems: Secondary | ICD-10-CM | POA: Diagnosis not present

## 2017-03-08 DIAGNOSIS — E119 Type 2 diabetes mellitus without complications: Secondary | ICD-10-CM | POA: Diagnosis not present

## 2017-03-08 DIAGNOSIS — M5410 Radiculopathy, site unspecified: Secondary | ICD-10-CM | POA: Diagnosis not present

## 2017-03-08 DIAGNOSIS — E038 Other specified hypothyroidism: Secondary | ICD-10-CM | POA: Diagnosis not present

## 2017-03-08 DIAGNOSIS — E784 Other hyperlipidemia: Secondary | ICD-10-CM | POA: Diagnosis not present

## 2017-03-08 DIAGNOSIS — I38 Endocarditis, valve unspecified: Secondary | ICD-10-CM | POA: Diagnosis not present

## 2017-03-08 DIAGNOSIS — I10 Essential (primary) hypertension: Secondary | ICD-10-CM | POA: Diagnosis not present

## 2017-03-08 DIAGNOSIS — R946 Abnormal results of thyroid function studies: Secondary | ICD-10-CM | POA: Diagnosis not present

## 2017-03-09 DIAGNOSIS — Z1212 Encounter for screening for malignant neoplasm of rectum: Secondary | ICD-10-CM | POA: Diagnosis not present

## 2017-03-24 DIAGNOSIS — K921 Melena: Secondary | ICD-10-CM | POA: Diagnosis not present

## 2017-05-25 ENCOUNTER — Ambulatory Visit (INDEPENDENT_AMBULATORY_CARE_PROVIDER_SITE_OTHER): Payer: Medicare Other | Admitting: Internal Medicine

## 2017-05-25 ENCOUNTER — Encounter: Payer: Self-pay | Admitting: Internal Medicine

## 2017-05-25 VITALS — BP 110/72 | HR 72 | Ht 70.25 in | Wt 180.0 lb

## 2017-05-25 DIAGNOSIS — R195 Other fecal abnormalities: Secondary | ICD-10-CM | POA: Diagnosis not present

## 2017-05-25 DIAGNOSIS — R194 Change in bowel habit: Secondary | ICD-10-CM | POA: Diagnosis not present

## 2017-05-25 MED ORDER — NA SULFATE-K SULFATE-MG SULF 17.5-3.13-1.6 GM/177ML PO SOLN: 1.0000 | Freq: Once | ORAL | 0 refills | Status: AC

## 2017-05-25 NOTE — Patient Instructions (Signed)

## 2017-05-25 NOTE — Progress Notes (Signed)
HISTORY OF PRESENT ILLNESS:  Nathaniel Mcclain is a 81 y.o. male with past medical history as listed below who has been followed in this office for GERD and adenomatous colon polyps. He is sent today by his primary care physician Dr. Reynaldo Minium regarding repeat Hemoccult positive stool. Patient has undergone multiple prior colonoscopies in 2002, 2006, 2011, and most recently October 2016. He has been known to have multiple diminutive adenomas. Last examination with 2 polyps. Routine follow-up secondary to age not recommended. For GERD she continues on daily PPI. This controls his reflux symptoms. He did have problems earlier this year with abrupt change in bowel habits in the form of constipation for which she needed to me daily disimpact himself. Since that time he has been troubled with constipation for which she has been using laxatives and stool softeners. He was evaluated by Dr. Reynaldo Minium in August. Hemoccult stool testing returned positive on 2 separate occasions. Review of outside blood work shows unremarkable comprehensive metabolic panel and CBC with hemoglobin 15.2. Patient has been compliant with his PPI therapy. He denies melena or hematochezia. He is known to have hemorrhoids. He is accompanied today by his wife  REVIEW OF SYSTEMS:  All non-GI ROS negative except for arthritis, back pain, visual change, hearing problems, muscle cramps, swollen lymph glands  Past Medical History:  Diagnosis Date  . Arthritis   . Colon polyps   . Diabetes ([place])   . GERD (gastroesophageal reflux disease)   . Hyperlipidemia   . Hypertension   . Hypothyroid   . Osteoarthritis   . TIA (transient ischemic attack) 1980    Past Surgical History:  Procedure Laterality Date  . THYROIDECTOMY  2008    Social History Nathaniel Mcclain  reports that he has never smoked. He has never used smokeless tobacco. He reports that he does not drink alcohol or use drugs.  family history includes Diabetes in his mother; Heart disease  in his mother.  No Known Allergies     PHYSICAL EXAMINATION: Vital signs: BP 110/72 (BP Location: Left Arm, Patient Position: Sitting, Cuff Size: Normal)   Pulse 72   Ht 5' 10.25" (1.784 m) Comment: height measured without shoes  Wt 180 lb (81.6 kg)   BMI 25.64 kg/m   Constitutional: generally well-appearing, no acute distress Psychiatric: alert and oriented x3, cooperative Eyes: extraocular movements intact, anicteric, conjunctiva pink Mouth: oral pharynx moist, no lesions Neck: supple no lymphadenopathy Cardiovascular: heart regular rate and rhythm, no murmur Lungs: clear to auscultation bilaterally Abdomen: soft, nontender, nondistended, no obvious ascites, no peritoneal signs, normal bowel sounds, no organomegaly Rectal: Deferred until colonoscopy Extremities: no clubbing, cyanosis, or lower extremity edema bilaterally Skin: no lesions on visible extremities Neuro: No focal deficits. Cranial nerves intact  ASSESSMENT:  #1. Hemoccult-positive stool. Etiology and clinical significance uncertain #2. Abrupt change in bowel habits with constipation #3. History of adenomatous colon polyps. Multiple prior colonoscopies. Last examination 2 years ago #4. GERD. Required PPI for control of symptoms. Doing well at this time   PLAN:  #1. We discussed the significance of Hemoccult-positive stool and possible etiologies #2. Advised and elected for colonoscopy to rule out interval lesion given recurrent Hemoccult-positive stool and change in bowel habits.The nature of the procedure, as well as the risks, benefits, and alternatives were carefully and thoroughly reviewed with the patient. Ample time for discussion and questions allowed. The patient understood, was satisfied, and agreed to proceed. #3. Reflux precautions #4. Continue PPI A copy of this consultation  note has been sent to Dr. Reynaldo Minium

## 2017-07-16 ENCOUNTER — Ambulatory Visit (AMBULATORY_SURGERY_CENTER): Payer: Medicare Other | Admitting: Internal Medicine

## 2017-07-16 ENCOUNTER — Encounter: Payer: Self-pay | Admitting: Internal Medicine

## 2017-07-16 ENCOUNTER — Other Ambulatory Visit: Payer: Self-pay

## 2017-07-16 VITALS — BP 147/80 | HR 52 | Temp 97.3°F | Resp 14 | Ht 70.0 in | Wt 180.0 lb

## 2017-07-16 DIAGNOSIS — R195 Other fecal abnormalities: Secondary | ICD-10-CM

## 2017-07-16 DIAGNOSIS — D122 Benign neoplasm of ascending colon: Secondary | ICD-10-CM

## 2017-07-16 DIAGNOSIS — Z1211 Encounter for screening for malignant neoplasm of colon: Secondary | ICD-10-CM | POA: Diagnosis not present

## 2017-07-16 MED ORDER — SODIUM CHLORIDE 0.9 % IV SOLN: 500.0000 mL | INTRAVENOUS | Status: DC

## 2017-07-16 NOTE — Op Note (Signed)
[facility] Patient Name: Nathaniel Mcclain Procedure Date: 07/16/2017 3:16 PM MRN: [id number] Endoscopist: Docia Chuck. Henrene Pastor , MD Age: 81 Referring MD:  Date of Birth: 1936-07-08 Gender: Male Account #: [id number] Procedure:                Colonoscopy, with cold snare polypectomy x 2 Indications:              Heme positive stool, and routine office testing.                            Multiple prior colonoscopies 2002, 2006, 2011,                            2016. History of tubular adenomas Medicines:                Monitored Anesthesia Care Procedure:                Pre-Anesthesia Assessment:                           - Prior to the procedure, a History and Physical                            was performed, and patient medications and                            allergies were reviewed. The patient's tolerance of                            previous anesthesia was also reviewed. The risks                            and benefits of the procedure and the sedation                            options and risks were discussed with the patient.                            All questions were answered, and informed consent                            was obtained. Prior Anticoagulants: The patient has                            taken no previous anticoagulant or antiplatelet                            agents. ASA Grade Assessment: II - A patient with                            mild systemic disease. After reviewing the risks                            and benefits, the patient was deemed in  satisfactory condition to undergo the procedure.                           After obtaining informed consent, the colonoscope                            was passed under direct vision. Throughout the                            procedure, the patient's blood pressure, pulse, and                            oxygen saturations were monitored continuously. The   Model CF-HQ190L [id number] scope was introduced                            through the anus and advanced to the the cecum,                            identified by appendiceal orifice and ileocecal                            valve. The ileocecal valve, appendiceal orifice,                            and rectum were photographed. The quality of the                            bowel preparation was excellent. The colonoscopy                            was performed with difficulty, secondary to sigmoid                            stenosis. The patient tolerated the procedure well.                            The bowel preparation used was SUPREP. Scope In: 3:37:46 PM Scope Out: 4:07:50 PM Scope Withdrawal Time: 0 hours 12 minutes 11 seconds  Total Procedure Duration: 0 hours 30 minutes 4 seconds  Findings:                 A 3 mm, 4 mm polyps were found in the ascending                            colon. The polyps were removed with a cold snare.                            Resection and retrieval (1 of 2) were complete.                           Multiple small and large-mouthed diverticula were  found in the left colon. There was SEVERE STENOSIS                            secondary to diverticular disease at the                            rectosigmoid junction. Required pediatric scope in                            order to pass with difficulty.                           Internal hemorrhoids were found during retroflexion.                           The exam was otherwise without abnormality on                            direct and retroflexion views. Complications:            No immediate complications. Estimated blood loss:                            None. Estimated Blood Loss:     Estimated blood loss: none. Impression:               - Ascending colon polyps, removed with a cold                            snare. Resected and retrieved.                           -  Diverticulosis in the left colon, with SEVERE                            STENOSIS at the rectosigmoid junction.                           - Internal hemorrhoids.                           - The examination was otherwise normal on direct                            and retroflexion views. Recommendation:           - Repeat colonoscopy is not recommended for                            surveillancehim a given findings and current age.                           - DO NOT RECOMMEND ROUTINE HEMOCCULT TESTING FOR                            SCREENING in this patient who has completed  surveillance colonoscopy program                           - Patient has a contact number available for                            emergencies. The signs and symptoms of potential                            delayed complications were discussed with the                            patient. Return to normal activities tomorrow.                            Written discharge instructions were provided to the                            patient.                           - Resume previous diet.                           - Continue present medications.                           - Await pathology results. Docia Chuck. Henrene Pastor, MD 07/16/2017 4:16:12 PM This report has been signed electronically.

## 2017-07-16 NOTE — Progress Notes (Signed)
A and O x3. Report to RN. Tolerated MAC anesthesia well.

## 2017-07-16 NOTE — Patient Instructions (Signed)
YOU HAD AN ENDOSCOPIC PROCEDURE TODAY AT THE [facility]:   Refer to the procedure report that was given to you for any specific questions about what was found during the examination.  If the procedure report does not answer your questions, please call your gastroenterologist to clarify.  If you requested that your care partner not be given the details of your procedure findings, then the procedure report has been included in a sealed envelope for you to review at your convenience later.  YOU SHOULD EXPECT: Some feelings of bloating in the abdomen. Passage of more gas than usual.  Walking can help get rid of the air that was put into your GI tract during the procedure and reduce the bloating. If you had a lower endoscopy (such as a colonoscopy or flexible sigmoidoscopy) you may notice spotting of blood in your stool or on the toilet paper. If you underwent a bowel prep for your procedure, you may not have a normal bowel movement for a few days.  Please Note:  You might notice some irritation and congestion in your nose or some drainage.  This is from the oxygen used during your procedure.  There is no need for concern and it should clear up in a day or so.  SYMPTOMS TO REPORT IMMEDIATELY:   Following lower endoscopy (colonoscopy or flexible sigmoidoscopy):  Excessive amounts of blood in the stool  Significant tenderness or worsening of abdominal pains  Swelling of the abdomen that is new, acute  Fever of 100F or higher  For urgent or emergent issues, a gastroenterologist can be reached at any hour by calling [phone].   DIET:  We do recommend a small meal at first, but then you may proceed to your regular diet.  Drink plenty of fluids but you should avoid alcoholic beverages for 24 hours.  ACTIVITY:  You should plan to take it easy for the rest of today and you should NOT DRIVE or use heavy machinery until tomorrow (because of the sedation medicines used during the test).     FOLLOW UP: Our staff will call the number listed on your records the next business day following your procedure to check on you and address any questions or concerns that you may have regarding the information given to you following your procedure. If we do not reach you, we will leave a message.  However, if you are feeling well and you are not experiencing any problems, there is no need to return our call.  We will assume that you have returned to your regular daily activities without incident.  If any biopsies were taken you will be contacted by phone or by letter within the next 1-3 weeks.  Please call us at [phone] if you have not heard about the biopsies in 3 weeks.    SIGNATURES/CONFIDENTIALITY: You and/or your care partner have signed paperwork which will be entered into your electronic medical record.  These signatures attest to the fact that that the information above on your After Visit Summary has been reviewed and is understood.  Full responsibility of the confidentiality of this discharge information lies with you and/or your care-partner  Polyp, diverticulosis, and hemorrhoid information given.. 

## 2017-07-16 NOTE — Progress Notes (Signed)
Called to room to assist during endoscopic procedure.  Patient ID and intended procedure confirmed with present staff. Received instructions for my participation in the procedure from the performing physician.  

## 2017-07-19 ENCOUNTER — Telehealth: Payer: Self-pay

## 2017-07-19 ENCOUNTER — Telehealth: Payer: Self-pay | Admitting: *Deleted

## 2017-07-19 NOTE — Telephone Encounter (Signed)
Left message on f/u call 

## 2017-07-19 NOTE — Telephone Encounter (Signed)
No answering machine. No message left.

## 2017-07-20 ENCOUNTER — Ambulatory Visit (INDEPENDENT_AMBULATORY_CARE_PROVIDER_SITE_OTHER): Payer: Medicare Other | Admitting: Orthopaedic Surgery

## 2017-07-20 ENCOUNTER — Encounter (INDEPENDENT_AMBULATORY_CARE_PROVIDER_SITE_OTHER): Payer: Self-pay | Admitting: Orthopaedic Surgery

## 2017-07-20 ENCOUNTER — Ambulatory Visit (INDEPENDENT_AMBULATORY_CARE_PROVIDER_SITE_OTHER): Payer: Medicare Other

## 2017-07-20 DIAGNOSIS — M25562 Pain in left knee: Secondary | ICD-10-CM | POA: Diagnosis not present

## 2017-07-20 MED ORDER — LIDOCAINE HCL 1 % IJ SOLN
3.0000 mL | INTRAMUSCULAR | Status: AC | PRN
  Administered 2017-07-20: 3 mL

## 2017-07-20 MED ORDER — METHYLPREDNISOLONE ACETATE 40 MG/ML IJ SUSP
40.0000 mg | INTRAMUSCULAR | Status: AC | PRN
  Administered 2017-07-20: 40 mg via INTRA_ARTICULAR

## 2017-07-20 NOTE — Progress Notes (Signed)
Office Visit Note   Patient: Nathaniel Mcclain           Date of Birth: 18-Oct-1935           MRN: [id number] Visit Date: 07/20/2017              Requested by: Nathaniel Bunting, MD [address] Nathaniel Mcclain, Nathaniel Mcclain [id number] PCP: Nathaniel Bunting, MD   Assessment & Plan: Visit Diagnoses:  1. Acute pain of left knee     Plan: We will have him follow back in 1 month check his progress lack of.  Nathaniel Mcclain is activities as tolerated.  Questions encouraged and answered by Dr. Ninfa Linden and m myself.  Follow-Up Instructions: Return in about 4 weeks (around 08/17/2017).   Orders:  Orders Placed This Encounter  Procedures  . Large Joint Inj  . XR Knee 1-2 Views Left   No orders of the defined types were placed in this encounter.     Procedures: Large Joint Inj: L knee on 07/20/2017 11:26 AM Indications: pain Details: 22 G 1.5 in needle, anterolateral approach  Arthrogram: No  Medications: 3 mL lidocaine 1 %; 40 mg methylPREDNISolone acetate 40 MG/ML Outcome: tolerated well, no immediate complications Procedure, treatment alternatives, risks and benefits explained, specific risks discussed. Consent was given by the patient. Immediately prior to procedure a time out was called to verify the correct patient, procedure, equipment, support staff and site/side marked as required. Patient was prepped and draped in the usual sterile fashion.       Clinical Data: No additional findings.   Subjective: Left knee pain  HPI Nathaniel Mcclain comes in today with left knee pain is been seen patient for the first time however his wife is well-known to Dr. Ninfa Linden service with a history of a right total knee arthroplasty.  Nathaniel Mcclain states the left knee is been bothering for the past 2 months after squatting down to fix a drain on the sink.  Nathaniel Mcclain denies any mechanical symptoms of the knee.  Nathaniel Mcclain states the pain is constant "weak pain" left knee.  Is tried flex all rub on it and I knee brace with some relief.  Nathaniel Mcclain denies any  waking pain.  His pain is unchanged since onset.  Nathaniel Mcclain has had some minimal swelling knee.  Review of Systems Denies any mechanical symptoms left knee.  Positive for left knee pain.  No waking pain.  Otherwise review of systems negative  Objective: Vital Signs: There were no vitals taken for this visit.  Physical Exam  Constitutional: Nathaniel Mcclain is oriented to person, place, and time. Nathaniel Mcclain appears well-developed and well-nourished. No distress.  Pulmonary/Chest: Effort normal.  Neurological: Nathaniel Mcclain is alert and oriented to person, place, and time.  Skin: Nathaniel Mcclain is not diaphoretic.  Psychiatric: Nathaniel Mcclain has a normal mood and affect.    Ortho Exam Bilateral knees excellent range of motion without pain.  McMurray's negative bilaterally.  No instability valgus varus stressing either knee husband [name] is negative bilaterally.  Left knee with slight edema compared to the right but no effusion of either knee no abnormal warmth no erythema.  Left knee with tenderness along medial lateral joint line.  No tenderness bilateral knees over the pedis anserine is region are patellotibial tendon. Specialty Comments:  No specialty comments available.  Imaging: Xr Knee 1-2 Views Left  Result Date: 07/20/2017 Left knee AP lateral: No acute fracture.  Some mild narrowing medial joint line.  Otherwise knee is well preserved.  No bony abnormalities otherwise.  PMFS History: There are no active problems to display for this patient.  Past Medical History:  Diagnosis Date  . Arthritis   . Colon polyps   . Diabetes ([place])   . GERD (gastroesophageal reflux disease)   . Hyperlipidemia   . Hypertension   . Hypothyroid   . Osteoarthritis   . TIA (transient ischemic attack) 1980    Family History  Problem Relation Age of Onset  . Diabetes Mother   . Heart disease Mother   . Colon cancer Neg Hx     Past Surgical History:  Procedure Laterality Date  . THYROIDECTOMY  2008   Social History   Occupational History  .  Not on file  Tobacco Use  . Smoking status: Never Smoker  . Smokeless tobacco: Never Used  Substance and Sexual Activity  . Alcohol use: No    Alcohol/week: 0.0 oz  . Drug use: No  . Sexual activity: Not on file

## 2017-07-20 NOTE — Progress Notes (Signed)
I have seen the patient and talked to him in length about his knee issues.  I have also discussed his case with Benita Stabile PA-C and reviewed Gil's note and agree with his findings and plan.  Given the acute nature of the pain we recommended a steroid injection for his left knee and the patient was definitely agreeable to trying this.  He tolerated it well.  We will see him back in a month to see how he is doing overall.

## 2017-07-23 ENCOUNTER — Encounter: Payer: Self-pay | Admitting: Internal Medicine

## 2017-08-19 ENCOUNTER — Ambulatory Visit (INDEPENDENT_AMBULATORY_CARE_PROVIDER_SITE_OTHER): Payer: Medicare Other | Admitting: Orthopaedic Surgery

## 2017-09-06 DIAGNOSIS — E781 Pure hyperglyceridemia: Secondary | ICD-10-CM | POA: Diagnosis not present

## 2017-09-06 DIAGNOSIS — R0989 Other specified symptoms and signs involving the circulatory and respiratory systems: Secondary | ICD-10-CM | POA: Diagnosis not present

## 2017-09-06 DIAGNOSIS — E038 Other specified hypothyroidism: Secondary | ICD-10-CM | POA: Diagnosis not present

## 2017-09-06 DIAGNOSIS — M5410 Radiculopathy, site unspecified: Secondary | ICD-10-CM | POA: Diagnosis not present

## 2017-09-06 DIAGNOSIS — Z23 Encounter for immunization: Secondary | ICD-10-CM | POA: Diagnosis not present

## 2017-09-06 DIAGNOSIS — M1990 Unspecified osteoarthritis, unspecified site: Secondary | ICD-10-CM | POA: Diagnosis not present

## 2017-09-06 DIAGNOSIS — I10 Essential (primary) hypertension: Secondary | ICD-10-CM | POA: Diagnosis not present

## 2017-09-06 DIAGNOSIS — E7849 Other hyperlipidemia: Secondary | ICD-10-CM | POA: Diagnosis not present

## 2017-09-06 DIAGNOSIS — K219 Gastro-esophageal reflux disease without esophagitis: Secondary | ICD-10-CM | POA: Diagnosis not present

## 2017-09-06 DIAGNOSIS — E119 Type 2 diabetes mellitus without complications: Secondary | ICD-10-CM | POA: Diagnosis not present

## 2017-09-06 DIAGNOSIS — I38 Endocarditis, valve unspecified: Secondary | ICD-10-CM | POA: Diagnosis not present

## 2017-09-06 DIAGNOSIS — Z1389 Encounter for screening for other disorder: Secondary | ICD-10-CM | POA: Diagnosis not present

## 2017-12-09 DIAGNOSIS — L570 Actinic keratosis: Secondary | ICD-10-CM | POA: Diagnosis not present

## 2017-12-09 DIAGNOSIS — L820 Inflamed seborrheic keratosis: Secondary | ICD-10-CM | POA: Diagnosis not present

## 2017-12-09 DIAGNOSIS — L821 Other seborrheic keratosis: Secondary | ICD-10-CM | POA: Diagnosis not present

## 2017-12-09 DIAGNOSIS — Z85828 Personal history of other malignant neoplasm of skin: Secondary | ICD-10-CM | POA: Diagnosis not present

## 2017-12-09 DIAGNOSIS — C44311 Basal cell carcinoma of skin of nose: Secondary | ICD-10-CM | POA: Diagnosis not present

## 2017-12-09 DIAGNOSIS — D2371 Other benign neoplasm of skin of right lower limb, including hip: Secondary | ICD-10-CM | POA: Diagnosis not present

## 2017-12-09 DIAGNOSIS — L814 Other melanin hyperpigmentation: Secondary | ICD-10-CM | POA: Diagnosis not present

## 2017-12-09 DIAGNOSIS — D1801 Hemangioma of skin and subcutaneous tissue: Secondary | ICD-10-CM | POA: Diagnosis not present

## 2018-01-06 DIAGNOSIS — C44311 Basal cell carcinoma of skin of nose: Secondary | ICD-10-CM | POA: Diagnosis not present

## 2018-01-06 DIAGNOSIS — Z85828 Personal history of other malignant neoplasm of skin: Secondary | ICD-10-CM | POA: Diagnosis not present

## 2018-01-19 DIAGNOSIS — E1169 Type 2 diabetes mellitus with other specified complication: Secondary | ICD-10-CM | POA: Diagnosis not present

## 2018-01-19 DIAGNOSIS — R0989 Other specified symptoms and signs involving the circulatory and respiratory systems: Secondary | ICD-10-CM | POA: Diagnosis not present

## 2018-01-19 DIAGNOSIS — I10 Essential (primary) hypertension: Secondary | ICD-10-CM | POA: Diagnosis not present

## 2018-01-19 DIAGNOSIS — E781 Pure hyperglyceridemia: Secondary | ICD-10-CM | POA: Diagnosis not present

## 2018-01-19 DIAGNOSIS — E038 Other specified hypothyroidism: Secondary | ICD-10-CM | POA: Diagnosis not present

## 2018-01-19 DIAGNOSIS — M1990 Unspecified osteoarthritis, unspecified site: Secondary | ICD-10-CM | POA: Diagnosis not present

## 2018-01-19 DIAGNOSIS — I38 Endocarditis, valve unspecified: Secondary | ICD-10-CM | POA: Diagnosis not present

## 2018-01-19 DIAGNOSIS — M5416 Radiculopathy, lumbar region: Secondary | ICD-10-CM | POA: Diagnosis not present

## 2018-01-19 DIAGNOSIS — K219 Gastro-esophageal reflux disease without esophagitis: Secondary | ICD-10-CM | POA: Diagnosis not present

## 2018-01-19 DIAGNOSIS — E7849 Other hyperlipidemia: Secondary | ICD-10-CM | POA: Diagnosis not present

## 2018-01-19 DIAGNOSIS — E663 Overweight: Secondary | ICD-10-CM | POA: Diagnosis not present

## 2018-01-19 DIAGNOSIS — Z6825 Body mass index (BMI) 25.0-25.9, adult: Secondary | ICD-10-CM | POA: Diagnosis not present

## 2018-06-13 DIAGNOSIS — E1169 Type 2 diabetes mellitus with other specified complication: Secondary | ICD-10-CM | POA: Diagnosis not present

## 2018-06-13 DIAGNOSIS — I10 Essential (primary) hypertension: Secondary | ICD-10-CM | POA: Diagnosis not present

## 2018-06-13 DIAGNOSIS — R82998 Other abnormal findings in urine: Secondary | ICD-10-CM | POA: Diagnosis not present

## 2018-06-13 DIAGNOSIS — Z125 Encounter for screening for malignant neoplasm of prostate: Secondary | ICD-10-CM | POA: Diagnosis not present

## 2018-06-13 DIAGNOSIS — E038 Other specified hypothyroidism: Secondary | ICD-10-CM | POA: Diagnosis not present

## 2018-06-13 DIAGNOSIS — Z23 Encounter for immunization: Secondary | ICD-10-CM | POA: Diagnosis not present

## 2018-06-13 DIAGNOSIS — E7849 Other hyperlipidemia: Secondary | ICD-10-CM | POA: Diagnosis not present

## 2018-06-20 DIAGNOSIS — E781 Pure hyperglyceridemia: Secondary | ICD-10-CM | POA: Diagnosis not present

## 2018-06-20 DIAGNOSIS — I10 Essential (primary) hypertension: Secondary | ICD-10-CM | POA: Diagnosis not present

## 2018-06-20 DIAGNOSIS — E1169 Type 2 diabetes mellitus with other specified complication: Secondary | ICD-10-CM | POA: Diagnosis not present

## 2018-06-20 DIAGNOSIS — Z6825 Body mass index (BMI) 25.0-25.9, adult: Secondary | ICD-10-CM | POA: Diagnosis not present

## 2018-06-20 DIAGNOSIS — I38 Endocarditis, valve unspecified: Secondary | ICD-10-CM | POA: Diagnosis not present

## 2018-06-20 DIAGNOSIS — E038 Other specified hypothyroidism: Secondary | ICD-10-CM | POA: Diagnosis not present

## 2018-06-20 DIAGNOSIS — R946 Abnormal results of thyroid function studies: Secondary | ICD-10-CM | POA: Diagnosis not present

## 2018-06-20 DIAGNOSIS — E7849 Other hyperlipidemia: Secondary | ICD-10-CM | POA: Diagnosis not present

## 2018-06-20 DIAGNOSIS — Z1212 Encounter for screening for malignant neoplasm of rectum: Secondary | ICD-10-CM | POA: Diagnosis not present

## 2018-06-20 DIAGNOSIS — M1990 Unspecified osteoarthritis, unspecified site: Secondary | ICD-10-CM | POA: Diagnosis not present

## 2018-06-20 DIAGNOSIS — Z0000 Encounter for general adult medical examination without abnormal findings: Secondary | ICD-10-CM | POA: Diagnosis not present

## 2018-06-20 DIAGNOSIS — R0989 Other specified symptoms and signs involving the circulatory and respiratory systems: Secondary | ICD-10-CM | POA: Diagnosis not present

## 2018-06-20 DIAGNOSIS — E663 Overweight: Secondary | ICD-10-CM | POA: Diagnosis not present

## 2018-08-09 DIAGNOSIS — L570 Actinic keratosis: Secondary | ICD-10-CM | POA: Diagnosis not present

## 2018-08-09 DIAGNOSIS — D0461 Carcinoma in situ of skin of right upper limb, including shoulder: Secondary | ICD-10-CM | POA: Diagnosis not present

## 2018-08-09 DIAGNOSIS — Z85828 Personal history of other malignant neoplasm of skin: Secondary | ICD-10-CM | POA: Diagnosis not present

## 2018-08-09 DIAGNOSIS — L821 Other seborrheic keratosis: Secondary | ICD-10-CM | POA: Diagnosis not present

## 2018-10-18 ENCOUNTER — Ambulatory Visit (INDEPENDENT_AMBULATORY_CARE_PROVIDER_SITE_OTHER): Payer: Medicare Other | Admitting: Physician Assistant

## 2018-10-19 DIAGNOSIS — K219 Gastro-esophageal reflux disease without esophagitis: Secondary | ICD-10-CM | POA: Diagnosis not present

## 2018-10-19 DIAGNOSIS — R279 Unspecified lack of coordination: Secondary | ICD-10-CM | POA: Diagnosis not present

## 2018-10-19 DIAGNOSIS — M1990 Unspecified osteoarthritis, unspecified site: Secondary | ICD-10-CM | POA: Diagnosis not present

## 2018-10-19 DIAGNOSIS — E663 Overweight: Secondary | ICD-10-CM | POA: Diagnosis not present

## 2018-10-19 DIAGNOSIS — I38 Endocarditis, valve unspecified: Secondary | ICD-10-CM | POA: Diagnosis not present

## 2018-10-19 DIAGNOSIS — E781 Pure hyperglyceridemia: Secondary | ICD-10-CM | POA: Diagnosis not present

## 2018-10-19 DIAGNOSIS — M5416 Radiculopathy, lumbar region: Secondary | ICD-10-CM | POA: Diagnosis not present

## 2018-10-19 DIAGNOSIS — R0989 Other specified symptoms and signs involving the circulatory and respiratory systems: Secondary | ICD-10-CM | POA: Diagnosis not present

## 2018-10-19 DIAGNOSIS — E7849 Other hyperlipidemia: Secondary | ICD-10-CM | POA: Diagnosis not present

## 2018-10-19 DIAGNOSIS — R946 Abnormal results of thyroid function studies: Secondary | ICD-10-CM | POA: Diagnosis not present

## 2018-10-19 DIAGNOSIS — I10 Essential (primary) hypertension: Secondary | ICD-10-CM | POA: Diagnosis not present

## 2018-10-19 DIAGNOSIS — E038 Other specified hypothyroidism: Secondary | ICD-10-CM | POA: Diagnosis not present

## 2018-10-19 DIAGNOSIS — E1169 Type 2 diabetes mellitus with other specified complication: Secondary | ICD-10-CM | POA: Diagnosis not present

## 2018-10-25 DIAGNOSIS — L821 Other seborrheic keratosis: Secondary | ICD-10-CM | POA: Diagnosis not present

## 2018-10-25 DIAGNOSIS — Z85828 Personal history of other malignant neoplasm of skin: Secondary | ICD-10-CM | POA: Diagnosis not present

## 2018-10-25 DIAGNOSIS — D044 Carcinoma in situ of skin of scalp and neck: Secondary | ICD-10-CM | POA: Diagnosis not present

## 2019-01-09 DIAGNOSIS — M9904 Segmental and somatic dysfunction of sacral region: Secondary | ICD-10-CM | POA: Diagnosis not present

## 2019-01-09 DIAGNOSIS — M9905 Segmental and somatic dysfunction of pelvic region: Secondary | ICD-10-CM | POA: Diagnosis not present

## 2019-01-09 DIAGNOSIS — M9903 Segmental and somatic dysfunction of lumbar region: Secondary | ICD-10-CM | POA: Diagnosis not present

## 2019-01-09 DIAGNOSIS — M5136 Other intervertebral disc degeneration, lumbar region: Secondary | ICD-10-CM | POA: Diagnosis not present

## 2019-01-31 DIAGNOSIS — Z20828 Contact with and (suspected) exposure to other viral communicable diseases: Secondary | ICD-10-CM | POA: Diagnosis not present

## 2019-02-08 DIAGNOSIS — M5136 Other intervertebral disc degeneration, lumbar region: Secondary | ICD-10-CM | POA: Diagnosis not present

## 2019-02-08 DIAGNOSIS — M9903 Segmental and somatic dysfunction of lumbar region: Secondary | ICD-10-CM | POA: Diagnosis not present

## 2019-02-08 DIAGNOSIS — M9905 Segmental and somatic dysfunction of pelvic region: Secondary | ICD-10-CM | POA: Diagnosis not present

## 2019-02-08 DIAGNOSIS — M9904 Segmental and somatic dysfunction of sacral region: Secondary | ICD-10-CM | POA: Diagnosis not present

## 2019-02-14 DIAGNOSIS — H35372 Puckering of macula, left eye: Secondary | ICD-10-CM | POA: Diagnosis not present

## 2019-02-14 DIAGNOSIS — H02831 Dermatochalasis of right upper eyelid: Secondary | ICD-10-CM | POA: Diagnosis not present

## 2019-02-14 DIAGNOSIS — H25813 Combined forms of age-related cataract, bilateral: Secondary | ICD-10-CM | POA: Diagnosis not present

## 2019-02-16 DIAGNOSIS — M9905 Segmental and somatic dysfunction of pelvic region: Secondary | ICD-10-CM | POA: Diagnosis not present

## 2019-02-16 DIAGNOSIS — M5136 Other intervertebral disc degeneration, lumbar region: Secondary | ICD-10-CM | POA: Diagnosis not present

## 2019-02-16 DIAGNOSIS — M9903 Segmental and somatic dysfunction of lumbar region: Secondary | ICD-10-CM | POA: Diagnosis not present

## 2019-02-16 DIAGNOSIS — M9904 Segmental and somatic dysfunction of sacral region: Secondary | ICD-10-CM | POA: Diagnosis not present

## 2019-02-23 DIAGNOSIS — M9905 Segmental and somatic dysfunction of pelvic region: Secondary | ICD-10-CM | POA: Diagnosis not present

## 2019-02-23 DIAGNOSIS — M9903 Segmental and somatic dysfunction of lumbar region: Secondary | ICD-10-CM | POA: Diagnosis not present

## 2019-02-23 DIAGNOSIS — M5136 Other intervertebral disc degeneration, lumbar region: Secondary | ICD-10-CM | POA: Diagnosis not present

## 2019-02-23 DIAGNOSIS — M9904 Segmental and somatic dysfunction of sacral region: Secondary | ICD-10-CM | POA: Diagnosis not present

## 2019-03-01 DIAGNOSIS — M9905 Segmental and somatic dysfunction of pelvic region: Secondary | ICD-10-CM | POA: Diagnosis not present

## 2019-03-01 DIAGNOSIS — M9903 Segmental and somatic dysfunction of lumbar region: Secondary | ICD-10-CM | POA: Diagnosis not present

## 2019-03-01 DIAGNOSIS — M9904 Segmental and somatic dysfunction of sacral region: Secondary | ICD-10-CM | POA: Diagnosis not present

## 2019-03-01 DIAGNOSIS — M5136 Other intervertebral disc degeneration, lumbar region: Secondary | ICD-10-CM | POA: Diagnosis not present

## 2019-03-08 DIAGNOSIS — M9905 Segmental and somatic dysfunction of pelvic region: Secondary | ICD-10-CM | POA: Diagnosis not present

## 2019-03-08 DIAGNOSIS — M9903 Segmental and somatic dysfunction of lumbar region: Secondary | ICD-10-CM | POA: Diagnosis not present

## 2019-03-08 DIAGNOSIS — M5136 Other intervertebral disc degeneration, lumbar region: Secondary | ICD-10-CM | POA: Diagnosis not present

## 2019-03-08 DIAGNOSIS — M9904 Segmental and somatic dysfunction of sacral region: Secondary | ICD-10-CM | POA: Diagnosis not present

## 2019-03-14 DIAGNOSIS — M9905 Segmental and somatic dysfunction of pelvic region: Secondary | ICD-10-CM | POA: Diagnosis not present

## 2019-03-14 DIAGNOSIS — M5136 Other intervertebral disc degeneration, lumbar region: Secondary | ICD-10-CM | POA: Diagnosis not present

## 2019-03-14 DIAGNOSIS — M9903 Segmental and somatic dysfunction of lumbar region: Secondary | ICD-10-CM | POA: Diagnosis not present

## 2019-03-14 DIAGNOSIS — M9904 Segmental and somatic dysfunction of sacral region: Secondary | ICD-10-CM | POA: Diagnosis not present

## 2019-03-21 DIAGNOSIS — M9905 Segmental and somatic dysfunction of pelvic region: Secondary | ICD-10-CM | POA: Diagnosis not present

## 2019-03-21 DIAGNOSIS — M9904 Segmental and somatic dysfunction of sacral region: Secondary | ICD-10-CM | POA: Diagnosis not present

## 2019-03-21 DIAGNOSIS — M9903 Segmental and somatic dysfunction of lumbar region: Secondary | ICD-10-CM | POA: Diagnosis not present

## 2019-03-21 DIAGNOSIS — M5136 Other intervertebral disc degeneration, lumbar region: Secondary | ICD-10-CM | POA: Diagnosis not present

## 2019-03-28 DIAGNOSIS — M9903 Segmental and somatic dysfunction of lumbar region: Secondary | ICD-10-CM | POA: Diagnosis not present

## 2019-03-28 DIAGNOSIS — M9905 Segmental and somatic dysfunction of pelvic region: Secondary | ICD-10-CM | POA: Diagnosis not present

## 2019-03-28 DIAGNOSIS — M5136 Other intervertebral disc degeneration, lumbar region: Secondary | ICD-10-CM | POA: Diagnosis not present

## 2019-03-28 DIAGNOSIS — M9904 Segmental and somatic dysfunction of sacral region: Secondary | ICD-10-CM | POA: Diagnosis not present

## 2019-04-04 DIAGNOSIS — M9904 Segmental and somatic dysfunction of sacral region: Secondary | ICD-10-CM | POA: Diagnosis not present

## 2019-04-04 DIAGNOSIS — M5136 Other intervertebral disc degeneration, lumbar region: Secondary | ICD-10-CM | POA: Diagnosis not present

## 2019-04-04 DIAGNOSIS — M9905 Segmental and somatic dysfunction of pelvic region: Secondary | ICD-10-CM | POA: Diagnosis not present

## 2019-04-04 DIAGNOSIS — M9903 Segmental and somatic dysfunction of lumbar region: Secondary | ICD-10-CM | POA: Diagnosis not present

## 2019-04-05 DIAGNOSIS — E663 Overweight: Secondary | ICD-10-CM | POA: Diagnosis not present

## 2019-04-05 DIAGNOSIS — E1169 Type 2 diabetes mellitus with other specified complication: Secondary | ICD-10-CM | POA: Diagnosis not present

## 2019-04-05 DIAGNOSIS — R0989 Other specified symptoms and signs involving the circulatory and respiratory systems: Secondary | ICD-10-CM | POA: Diagnosis not present

## 2019-04-05 DIAGNOSIS — E781 Pure hyperglyceridemia: Secondary | ICD-10-CM | POA: Diagnosis not present

## 2019-04-05 DIAGNOSIS — R946 Abnormal results of thyroid function studies: Secondary | ICD-10-CM | POA: Diagnosis not present

## 2019-04-05 DIAGNOSIS — K219 Gastro-esophageal reflux disease without esophagitis: Secondary | ICD-10-CM | POA: Diagnosis not present

## 2019-04-05 DIAGNOSIS — E785 Hyperlipidemia, unspecified: Secondary | ICD-10-CM | POA: Diagnosis not present

## 2019-04-05 DIAGNOSIS — I38 Endocarditis, valve unspecified: Secondary | ICD-10-CM | POA: Diagnosis not present

## 2019-04-05 DIAGNOSIS — I10 Essential (primary) hypertension: Secondary | ICD-10-CM | POA: Diagnosis not present

## 2019-04-05 DIAGNOSIS — M1990 Unspecified osteoarthritis, unspecified site: Secondary | ICD-10-CM | POA: Diagnosis not present

## 2019-04-05 DIAGNOSIS — E039 Hypothyroidism, unspecified: Secondary | ICD-10-CM | POA: Diagnosis not present

## 2019-04-05 DIAGNOSIS — M5416 Radiculopathy, lumbar region: Secondary | ICD-10-CM | POA: Diagnosis not present

## 2019-04-07 DIAGNOSIS — Z23 Encounter for immunization: Secondary | ICD-10-CM | POA: Diagnosis not present

## 2019-04-11 DIAGNOSIS — M9904 Segmental and somatic dysfunction of sacral region: Secondary | ICD-10-CM | POA: Diagnosis not present

## 2019-04-11 DIAGNOSIS — M9905 Segmental and somatic dysfunction of pelvic region: Secondary | ICD-10-CM | POA: Diagnosis not present

## 2019-04-11 DIAGNOSIS — M5136 Other intervertebral disc degeneration, lumbar region: Secondary | ICD-10-CM | POA: Diagnosis not present

## 2019-04-11 DIAGNOSIS — M9903 Segmental and somatic dysfunction of lumbar region: Secondary | ICD-10-CM | POA: Diagnosis not present

## 2019-05-12 DIAGNOSIS — H2512 Age-related nuclear cataract, left eye: Secondary | ICD-10-CM | POA: Diagnosis not present

## 2019-05-12 DIAGNOSIS — H25812 Combined forms of age-related cataract, left eye: Secondary | ICD-10-CM | POA: Diagnosis not present

## 2019-06-15 DIAGNOSIS — I7090 Unspecified atherosclerosis: Secondary | ICD-10-CM | POA: Diagnosis not present

## 2019-06-15 DIAGNOSIS — R350 Frequency of micturition: Secondary | ICD-10-CM | POA: Diagnosis not present

## 2019-06-15 DIAGNOSIS — R8281 Pyuria: Secondary | ICD-10-CM | POA: Diagnosis not present

## 2019-06-15 DIAGNOSIS — N390 Urinary tract infection, site not specified: Secondary | ICD-10-CM | POA: Diagnosis not present

## 2019-06-15 DIAGNOSIS — M1990 Unspecified osteoarthritis, unspecified site: Secondary | ICD-10-CM | POA: Diagnosis not present

## 2019-06-15 DIAGNOSIS — M545 Low back pain: Secondary | ICD-10-CM | POA: Diagnosis not present

## 2019-06-15 DIAGNOSIS — R279 Unspecified lack of coordination: Secondary | ICD-10-CM | POA: Diagnosis not present

## 2019-06-15 DIAGNOSIS — M5136 Other intervertebral disc degeneration, lumbar region: Secondary | ICD-10-CM | POA: Diagnosis not present

## 2019-06-15 DIAGNOSIS — E1169 Type 2 diabetes mellitus with other specified complication: Secondary | ICD-10-CM | POA: Diagnosis not present

## 2019-06-19 ENCOUNTER — Ambulatory Visit (INDEPENDENT_AMBULATORY_CARE_PROVIDER_SITE_OTHER): Payer: Medicare Other

## 2019-06-19 ENCOUNTER — Other Ambulatory Visit: Payer: Self-pay

## 2019-06-19 ENCOUNTER — Encounter: Payer: Self-pay | Admitting: Orthopaedic Surgery

## 2019-06-19 ENCOUNTER — Ambulatory Visit (INDEPENDENT_AMBULATORY_CARE_PROVIDER_SITE_OTHER): Payer: Medicare Other | Admitting: Orthopaedic Surgery

## 2019-06-19 DIAGNOSIS — M25551 Pain in right hip: Secondary | ICD-10-CM

## 2019-06-19 DIAGNOSIS — M4807 Spinal stenosis, lumbosacral region: Secondary | ICD-10-CM

## 2019-06-19 DIAGNOSIS — G8929 Other chronic pain: Secondary | ICD-10-CM | POA: Diagnosis not present

## 2019-06-19 DIAGNOSIS — M5441 Lumbago with sciatica, right side: Secondary | ICD-10-CM

## 2019-06-19 NOTE — Progress Notes (Signed)
Office Visit Note   Patient: Nathaniel Mcclain           Date of Birth: 1936-03-29           MRN: [id number] Visit Date: 06/19/2019              Requested by: Burnard Bunting, MD [address] [place],  [place] [id number] PCP: Burnard Bunting, MD   Assessment & Plan: Visit Diagnoses:  1. Pain in right hip   2. Chronic right-sided low back pain with right-sided sciatica     Plan: Given the significant findings on plain film of his lumbar spine that are concerning for worsening stenosis and nerve compression, a MRI of the lumbar spine is warranted to help [place] consider whether or not a guided injection such as an epidural steroid injection would be helpful.  He will work on back extension exercises in the interim which may not help a lot given his spondylolisthesis but he can work on core strengthening as well.  Follow-Up Instructions: No follow-ups on file.  He will call for follow-up once he has the MRI of his lumbar spine.  Orders:  Orders Placed This Encounter  Procedures  . XR HIP UNILAT W OR W/O PELVIS 1V RIGHT  . XR Lumbar Spine 2-3 Views   No orders of the defined types were placed in this encounter.     Procedures: No procedures performed   Clinical Data: No additional findings.   Subjective: Chief Complaint  Patient presents with  . Right Hip - Pain  . Lower Back - Pain  The patient comes in with chronic history of right sided hip and low back pain with sciatica.  This has been going on for many years now.  He is 83 years old.  This seems to be getting worse with activities.  He denies any weakness in his leg but he does report radiating pain down his right lower extremity.  He denies any change in bowel bladder function.  He is work on activity modification.  He is a thin individual.  This seems to slowly be getting worse and is starting to detrimentally affect his mobility and his quality of life.  HPI  Review of Systems He currently denies any headache, chest  pain, shortness of breath, fever, chills, nausea, vomiting  Objective: Vital Signs: There were no vitals taken for this visit.  Physical Exam He is alert and orient x3 and in no acute distress Ortho Exam Examination of his right lower extremity shows full range of motion of the right hip and right knee.  There is no pain in the groin with compression of the hip or on the extremes of rotation.  His pain seems to be in the sciatic area in the hamstrings.  He does have a positive straight leg raise on the right side.  He also does have significant numbness in L5 and S1 with the bottom of his foot as well as the side of his foot.  He is not a diabetic of note. Specialty Comments:  No specialty comments available.  Imaging: Xr Hip Unilat W Or W/o Pelvis 1v Right  Result Date: 06/19/2019 An AP pelvis and lateral right hip shows a well-maintained right hip joint with no acute findings and no significant arthritis at all.  Xr Lumbar Spine 2-3 Views  Result Date: 06/19/2019 2 views of the lumbar spine shows severe degenerative disc disease at L5-S1 with almost complete loss of the disc space and a spondylolisthesis.  PMFS History: There are no active problems to display for this patient.  Past Medical History:  Diagnosis Date  . Arthritis   . Colon polyps   . Diabetes ([place])   . GERD (gastroesophageal reflux disease)   . Hyperlipidemia   . Hypertension   . Hypothyroid   . Osteoarthritis   . TIA (transient ischemic attack) 1980    Family History  Problem Relation Age of Onset  . Diabetes Mother   . Heart disease Mother   . Colon cancer Neg Hx     Past Surgical History:  Procedure Laterality Date  . THYROIDECTOMY  2008   Social History   Occupational History  . Not on file  Tobacco Use  . Smoking status: Never Smoker  . Smokeless tobacco: Never Used  Substance and Sexual Activity  . Alcohol use: No    Alcohol/week: 0.0 standard drinks  . Drug use: No  . Sexual  activity: Not on file

## 2019-06-26 ENCOUNTER — Other Ambulatory Visit: Payer: Self-pay

## 2019-06-26 ENCOUNTER — Ambulatory Visit
Admission: RE | Admit: 2019-06-26 | Discharge: 2019-06-26 | Disposition: A | Discharge status: Home / Self Care | Payer: Medicare Other | Source: Ambulatory Visit | Attending: Orthopaedic Surgery | Admitting: Orthopaedic Surgery

## 2019-06-26 DIAGNOSIS — M4807 Spinal stenosis, lumbosacral region: Secondary | ICD-10-CM

## 2019-06-26 DIAGNOSIS — M48061 Spinal stenosis, lumbar region without neurogenic claudication: Secondary | ICD-10-CM | POA: Diagnosis not present

## 2019-07-10 ENCOUNTER — Other Ambulatory Visit: Payer: Self-pay

## 2019-07-10 ENCOUNTER — Ambulatory Visit (INDEPENDENT_AMBULATORY_CARE_PROVIDER_SITE_OTHER): Payer: Medicare Other | Admitting: Orthopaedic Surgery

## 2019-07-10 ENCOUNTER — Encounter: Payer: Self-pay | Admitting: Orthopaedic Surgery

## 2019-07-10 DIAGNOSIS — M4807 Spinal stenosis, lumbosacral region: Secondary | ICD-10-CM | POA: Diagnosis not present

## 2019-07-10 DIAGNOSIS — M5441 Lumbago with sciatica, right side: Secondary | ICD-10-CM | POA: Diagnosis not present

## 2019-07-10 DIAGNOSIS — G8929 Other chronic pain: Secondary | ICD-10-CM | POA: Diagnosis not present

## 2019-07-10 DIAGNOSIS — M25551 Pain in right hip: Secondary | ICD-10-CM

## 2019-07-10 NOTE — Progress Notes (Signed)
The patient is a very pleasant 83 year old active gentleman who comes in for follow-up after an MRI of his lumbar spine.  He has significant low back pain which keeps him from walking long distances.  He also has right hip pain but it is in the sciatic region where he points the source of his pain.  It is deep and its along the ischium on my exam.  His right hip exam is otherwise normal.  We will send in for the MRI due to his continued symptoms.  On exam he does walk slightly hunched over.  He has pain across the lower lumbar spine at several levels.  He has negative straight leg raise bilaterally and he has fluid and full range of motion of both hips.  His pain again seems to be more on the ischium on the right side.  The MRI of his lumbar spine does show chronic pars defects at L5-S1.  The radiologist comments that there may be some impingement or irritation at L5 bilaterally and this may be due to a central disc at L4-L5.  There is some facet arthritic changes as well.  I would like to send him to Dr. Ernestina Patches for intervention in his spine.  I am not sure as if it should be both the right L5 nerve as well as the L4-L5 facets bilaterally based on his pain in the low back and to the right side.  We will work on scheduling that intervention with Dr. Ernestina Patches.  I would like to see him back myself in about 6 weeks.  All question concerns were answered addressed.  He was in favor of trying some type of intervention to see if this would help.

## 2019-08-03 ENCOUNTER — Ambulatory Visit (INDEPENDENT_AMBULATORY_CARE_PROVIDER_SITE_OTHER): Payer: Medicare Other | Admitting: Physical Medicine and Rehabilitation

## 2019-08-03 ENCOUNTER — Ambulatory Visit: Payer: Self-pay

## 2019-08-03 ENCOUNTER — Encounter: Payer: Self-pay | Admitting: Physical Medicine and Rehabilitation

## 2019-08-03 ENCOUNTER — Other Ambulatory Visit: Payer: Self-pay

## 2019-08-03 VITALS — BP 137/75 | HR 74

## 2019-08-03 DIAGNOSIS — M5416 Radiculopathy, lumbar region: Secondary | ICD-10-CM | POA: Diagnosis not present

## 2019-08-03 MED ORDER — METHYLPREDNISOLONE ACETATE 80 MG/ML IJ SUSP
40.0000 mg | Freq: Once | INTRAMUSCULAR | Status: AC
  Administered 2019-08-03: 40 mg

## 2019-08-03 NOTE — Progress Notes (Signed)
.  Numeric Pain Rating Scale and Functional Assessment Average Pain 10   In the last MONTH (on 0-10 scale) has pain interfered with the following?  1. General activity like being  able to carry out your everyday physical activities such as walking, climbing stairs, carrying groceries, or moving a chair?  Rating(9)   +Driver, -BT, -Dye Allergies.  

## 2019-08-03 NOTE — Procedures (Signed)
Lumbar Epidural Steroid Injection - Interlaminar Approach with Fluoroscopic Guidance  Patient: Nathaniel Mcclain      Date of Birth: [birth year] MRN: [id number] PCP: Burnard Bunting, MD      Visit Date: 08/03/2019   Universal Protocol:     Consent Given By: the patient  Position: PRONE  Additional Comments: Vital signs were monitored before and after the procedure. Patient was prepped and draped in the usual sterile fashion. The correct patient, procedure, and site was verified.   Injection Procedure Details:  Procedure Site One Meds Administered:  Meds ordered this encounter  Medications  . methylPREDNISolone acetate (DEPO-MEDROL) injection 40 mg     Laterality: Right  Location/Site:  L5-S1  Needle size: 20 G  Needle type: Tuohy  Needle Placement: Paramedian epidural  Findings:   -Comments: Excellent flow of contrast into the epidural space.  Procedure Details: Using a paramedian approach from the side mentioned above, the region overlying the inferior lamina was localized under fluoroscopic visualization and the soft tissues overlying this structure were infiltrated with 4 ml. of 1% Lidocaine without Epinephrine. The Tuohy needle was inserted into the epidural space using a paramedian approach.   The epidural space was localized using loss of resistance along with lateral and bi-planar fluoroscopic views.  After negative aspirate for air, blood, and CSF, a 2 ml. volume of Isovue-250 was injected into the epidural space and the flow of contrast was observed. Radiographs were obtained for documentation purposes.    The injectate was administered into the level noted above.   Additional Comments:  The patient tolerated the procedure well Dressing: 2 x 2 sterile gauze and Band-Aid    Post-procedure details: Patient was observed during the procedure. Post-procedure instructions were reviewed.  Patient left the clinic in stable condition.

## 2019-08-03 NOTE — Progress Notes (Signed)
Geffery Sculthorpe Donahey - 83 y.o. male MRN [id number]  Date of birth: [birth year]  Office Visit Note: Visit Date: 08/03/2019 PCP: Burnard Bunting, MD Referred by: Burnard Bunting, MD  Subjective: Chief Complaint  Patient presents with  . Lower Back - Pain   HPI: TRAYVION NEYENS is a 83 y.o. male who comes in today At the request of Dr. Jean Rosenthal for lumbar spine interventional procedure.  Patient has had chronic ongoing low back and in particular right buttock pain.  He reports 2 to 3 years of worsening symptoms which has been progressive.  He rates his pain as a 10 out of 10.  It does not allow him to walk for any distance now he is to be able to walk fairly well.  He reports not trying much to help with pain although he has had some medication and activity modification.  MRI of the lumbar spine has been performed and is reviewed with the patient today and reviewed with spine model.  He has a grade 1 spondylolisthesis of L5 on S1 with bilateral pars defects.  He has facet arthritis at this level as well and some level of foraminal narrowing bilaterally.  Actually has more left-sided foraminal narrowing than right at L5-S1.  At L4-5 he actually has central protrusion with multifactorial moderate narrowing of the lateral recesses and central canal.  Either 1 of these areas can be causing his buttock pain.  I do not think is related specifically to the pars defect itself or the facet arthropathy although I cannot rule that out.  We will complete interlaminar epidural steroid injection at L5-S1 on the right.  Depending on results could look at S1 transforaminal injection versus diagnostic facet block.  ROS Otherwise per HPI.  Assessment & Plan: Visit Diagnoses:  1. Lumbar radiculopathy     Plan: No additional findings.   Meds & Orders:  Meds ordered this encounter  Medications  . methylPREDNISolone acetate (DEPO-MEDROL) injection 40 mg    Orders Placed This Encounter  Procedures  . XR C-ARM  NO REPORT  . Epidural Steroid injection    Follow-up: Return if symptoms worsen or fail to improve.   Procedures: No procedures performed  Lumbar Epidural Steroid Injection - Interlaminar Approach with Fluoroscopic Guidance  Patient: MARQUIL MINE      Date of Birth: [birth year] MRN: [id number] PCP: Burnard Bunting, MD      Visit Date: 08/03/2019   Universal Protocol:     Consent Given By: the patient  Position: PRONE  Additional Comments: Vital signs were monitored before and after the procedure. Patient was prepped and draped in the usual sterile fashion. The correct patient, procedure, and site was verified.   Injection Procedure Details:  Procedure Site One Meds Administered:  Meds ordered this encounter  Medications  . methylPREDNISolone acetate (DEPO-MEDROL) injection 40 mg     Laterality: Right  Location/Site:  L5-S1  Needle size: 20 G  Needle type: Tuohy  Needle Placement: Paramedian epidural  Findings:   -Comments: Excellent flow of contrast into the epidural space.  Procedure Details: Using a paramedian approach from the side mentioned above, the region overlying the inferior lamina was localized under fluoroscopic visualization and the soft tissues overlying this structure were infiltrated with 4 ml. of 1% Lidocaine without Epinephrine. The Tuohy needle was inserted into the epidural space using a paramedian approach.   The epidural space was localized using loss of resistance along with lateral and bi-planar fluoroscopic views.  After negative  aspirate for air, blood, and CSF, a 2 ml. volume of Isovue-250 was injected into the epidural space and the flow of contrast was observed. Radiographs were obtained for documentation purposes.    The injectate was administered into the level noted above.   Additional Comments:  The patient tolerated the procedure well Dressing: 2 x 2 sterile gauze and Band-Aid    Post-procedure details: Patient was observed  during the procedure. Post-procedure instructions were reviewed.  Patient left the clinic in stable condition.    Clinical History: MRI LUMBAR SPINE WITHOUT CONTRAST  TECHNIQUE: Multiplanar, multisequence MR imaging of the lumbar spine was performed. No intravenous contrast was administered.  COMPARISON:  Radiography 7 days ago  FINDINGS: Segmentation:  5 lumbar type vertebrae  Alignment:  Grade 1/2 anterolisthesis at L5-S1  Vertebrae: No recent fracture, evidence of discitis, or bone lesion. Chronic L5 pars defects.  Conus medullaris and cauda equina: Conus extends to the L1 level. Conus and cauda equina appear normal.  Paraspinal and other soft tissues: Bilateral renal cystic intensities.  Disc levels:  T12- L1: Unremarkable.  L1-L2: Mild disc bulging and mild facet spurring.  No impingement  L2-L3: Mild disc bulging.  Negative facets.  No impingement  L3-L4: Mild disc bulging.  Negative facets.  No impingement  L4-L5: Disc narrowing and bulging with central protrusion. Degenerative facet spurring asymmetric to the left. Bilateral subarticular recess stenosis which could affect the L5 nerve roots. Spinal stenosis is overall moderate.  L5-S1:Chronic L5 pars defects with focal advanced degenerative disc narrowing and annular fissures. There is slip and advanced disc height loss. The canal and foramina remain patent  IMPRESSION: 1. L5 chronic pars defects with L5-S1 anterolisthesis and advanced disc degeneration. No static neural compression at this level. 2. L4-5 disc bulge and facet arthropathy with bilateral subarticular recess narrowing. Spinal stenosis is low moderate.   Electronically Signed   By: Monte Fantasia M.D.   On: 06/27/2019 06:53   He reports that he has never smoked. He has never used smokeless tobacco. No results for input(s): HGBA1C, LABURIC in the last 8760 hours.  Objective:  VS:  HT:    WT:   BMI:     BP:137/75   HR:74bpm  TEMP: ( )  RESP:  Physical Exam  Ortho Exam Imaging: XR C-ARM NO REPORT  Result Date: 08/03/2019 Please see Notes tab for imaging impression.   Past Medical/Family/Surgical/Social History: Medications & Allergies reviewed per EMR, new medications updated. There are no problems to display for this patient.  Past Medical History:  Diagnosis Date  . Arthritis   . Colon polyps   . Diabetes ([place])   . GERD (gastroesophageal reflux disease)   . Hyperlipidemia   . Hypertension   . Hypothyroid   . Osteoarthritis   . TIA (transient ischemic attack) 1980   Family History  Problem Relation Age of Onset  . Diabetes Mother   . Heart disease Mother   . Colon cancer Neg Hx    Past Surgical History:  Procedure Laterality Date  . THYROIDECTOMY  2008   Social History   Occupational History  . Not on file  Tobacco Use  . Smoking status: Never Smoker  . Smokeless tobacco: Never Used  Substance and Sexual Activity  . Alcohol use: No    Alcohol/week: 0.0 standard drinks  . Drug use: No  . Sexual activity: Not on file

## 2019-08-08 ENCOUNTER — Ambulatory Visit: Payer: Medicare Other | Attending: Internal Medicine

## 2019-08-08 DIAGNOSIS — Z20822 Contact with and (suspected) exposure to covid-19: Secondary | ICD-10-CM

## 2019-08-11 LAB — NOVEL CORONAVIRUS, NAA: SARS-CoV-2, NAA: NOT DETECTED

## 2019-08-21 ENCOUNTER — Ambulatory Visit: Payer: Medicare Other | Admitting: Orthopaedic Surgery

## 2019-08-23 ENCOUNTER — Ambulatory Visit (INDEPENDENT_AMBULATORY_CARE_PROVIDER_SITE_OTHER): Payer: Medicare Other | Admitting: Orthopaedic Surgery

## 2019-08-23 ENCOUNTER — Encounter: Payer: Self-pay | Admitting: Orthopaedic Surgery

## 2019-08-23 ENCOUNTER — Other Ambulatory Visit: Payer: Self-pay

## 2019-08-23 DIAGNOSIS — M4807 Spinal stenosis, lumbosacral region: Secondary | ICD-10-CM | POA: Diagnosis not present

## 2019-08-23 NOTE — Progress Notes (Signed)
   Office Visit Note   Patient: Nathaniel Mcclain           Date of Birth: 1935/08/15           MRN: [id number] Visit Date: 08/23/2019              Requested by: Burnard Bunting, MD [address] [place],  [place] [address] PCP: Burnard Bunting, MD   Assessment & Plan: Visit Diagnoses:  1. Spinal stenosis of lumbosacral region     Plan:  He is given a handout on back exercises which had like for him to do at least every other day.  He will call us if he develops any recurrent back pain particularly with radicular symptoms down the right leg.  He may benefit from future epidural steroid injection with Dr. Ernestina Patches.  Otherwise follow-up as needed  Follow-Up Instructions: Return if symptoms worsen or fail to improve.   Orders:  No orders of the defined types were placed in this encounter.  No orders of the defined types were placed in this encounter.     Procedures: No procedures performed   Clinical Data: No additional findings.   Subjective: Chief Complaint  Patient presents with  . Lower Back - Follow-up    HPI Mr. He returns today status post right L5/S1 paramedian epidural steroid injection 08/03/2019.  He states that the injection can work after a week or so.  He is no longer having numbness tingling down the right leg.  No pain.  However he does state he has not walked for any long distances.  He has some discomfort with working in the yard and bending a lot.  He has been working on cleaning out a garage building up a friend who recently passed away. Review of Systems Please see HPI otherwise negative  Objective: Vital Signs: There were no vitals taken for this visit.  Physical Exam General: No acute distress.  Mood and affect appropriate Ortho Exam Lumbar spine is nontender over the lumbar spinal column and paraspinous region.  He has full flexion is able to touch his toes.  No pain with extension lumbar spine.  He has 5/5 strength throughout the lower extremities  against resistance.  Good range of motion of both hips without pain.  Dorsiflexion of bilateral ankles intact.  Ambulates with a slow antalgic gait with slight kyphosis. Specialty Comments:  No specialty comments available.  Imaging: No results found.   PMFS History: There are no problems to display for this patient.  Past Medical History:  Diagnosis Date  . Arthritis   . Colon polyps   . Diabetes ([place])   . GERD (gastroesophageal reflux disease)   . Hyperlipidemia   . Hypertension   . Hypothyroid   . Osteoarthritis   . TIA (transient ischemic attack) 1980    Family History  Problem Relation Age of Onset  . Diabetes Mother   . Heart disease Mother   . Colon cancer Neg Hx     Past Surgical History:  Procedure Laterality Date  . THYROIDECTOMY  2008   Social History   Occupational History  . Not on file  Tobacco Use  . Smoking status: Never Smoker  . Smokeless tobacco: Never Used  Substance and Sexual Activity  . Alcohol use: No    Alcohol/week: 0.0 standard drinks  . Drug use: No  . Sexual activity: Not on file

## 2019-09-29 ENCOUNTER — Ambulatory Visit: Payer: Medicare Other | Attending: Internal Medicine

## 2019-09-29 DIAGNOSIS — Z23 Encounter for immunization: Secondary | ICD-10-CM

## 2019-09-29 NOTE — Progress Notes (Signed)
   Covid-19 Vaccination Clinic  Name:  Nathaniel Mcclain    MRN: [id number] DOB: February 17, 1936  09/29/2019  [name] was observed post Covid-19 immunization for 15 minutes without incidence. He was provided with Vaccine Information Sheet and instruction to access the V-Safe system.   [name] was instructed to call 911 with any severe reactions post vaccine: Marland Kitchen Difficulty breathing  . Swelling of your face and throat  . A fast heartbeat  . A bad rash all over your body  . Dizziness and weakness    Immunizations Administered    Name Date Dose VIS Date Route   Pfizer COVID-19 Vaccine 09/29/2019  3:49 PM 0.3 mL 07/14/2019 Intramuscular   Manufacturer: [place]   Lot: [id number]   [place]: [id number]

## 2019-10-25 ENCOUNTER — Ambulatory Visit: Payer: Medicare Other | Attending: Internal Medicine

## 2019-10-25 DIAGNOSIS — Z23 Encounter for immunization: Secondary | ICD-10-CM

## 2019-10-25 NOTE — Progress Notes (Signed)
   Covid-19 Vaccination Clinic  Name:  EMANUELLE FOLWELL    MRN: [id number] DOB: Mar 12, 1936  10/25/2019  Mr. Orejel was observed post Covid-19 immunization for 15 minutes without incident. He was provided with Vaccine Information Sheet and instruction to access the V-Safe system.   Mr. Starosta was instructed to call 911 with any severe reactions post vaccine: Marland Kitchen Difficulty breathing  . Swelling of face and throat  . A fast heartbeat  . A bad rash all over body  . Dizziness and weakness   Immunizations Administered    Name Date Dose VIS Date Route   Pfizer COVID-19 Vaccine 10/25/2019  9:51 AM 0.3 mL 07/14/2019 Intramuscular   Manufacturer: [place]   Lot: [id number]   [place]: [id number]

## 2019-11-08 DIAGNOSIS — E1169 Type 2 diabetes mellitus with other specified complication: Secondary | ICD-10-CM | POA: Diagnosis not present

## 2019-11-08 DIAGNOSIS — E663 Overweight: Secondary | ICD-10-CM | POA: Diagnosis not present

## 2019-11-08 DIAGNOSIS — M1990 Unspecified osteoarthritis, unspecified site: Secondary | ICD-10-CM | POA: Diagnosis not present

## 2019-11-08 DIAGNOSIS — I10 Essential (primary) hypertension: Secondary | ICD-10-CM | POA: Diagnosis not present

## 2019-11-09 DIAGNOSIS — Z961 Presence of intraocular lens: Secondary | ICD-10-CM | POA: Diagnosis not present

## 2019-11-09 DIAGNOSIS — H25811 Combined forms of age-related cataract, right eye: Secondary | ICD-10-CM | POA: Diagnosis not present

## 2019-11-09 DIAGNOSIS — H2511 Age-related nuclear cataract, right eye: Secondary | ICD-10-CM | POA: Diagnosis not present

## 2019-11-09 DIAGNOSIS — Z01818 Encounter for other preprocedural examination: Secondary | ICD-10-CM | POA: Diagnosis not present

## 2020-03-11 DIAGNOSIS — E663 Overweight: Secondary | ICD-10-CM | POA: Diagnosis not present

## 2020-03-11 DIAGNOSIS — M5416 Radiculopathy, lumbar region: Secondary | ICD-10-CM | POA: Diagnosis not present

## 2020-03-11 DIAGNOSIS — M5136 Other intervertebral disc degeneration, lumbar region: Secondary | ICD-10-CM | POA: Diagnosis not present

## 2020-03-11 DIAGNOSIS — K219 Gastro-esophageal reflux disease without esophagitis: Secondary | ICD-10-CM | POA: Diagnosis not present

## 2020-03-11 DIAGNOSIS — R351 Nocturia: Secondary | ICD-10-CM | POA: Diagnosis not present

## 2020-03-11 DIAGNOSIS — E1169 Type 2 diabetes mellitus with other specified complication: Secondary | ICD-10-CM | POA: Diagnosis not present

## 2020-04-10 DIAGNOSIS — L570 Actinic keratosis: Secondary | ICD-10-CM | POA: Diagnosis not present

## 2020-04-10 DIAGNOSIS — L820 Inflamed seborrheic keratosis: Secondary | ICD-10-CM | POA: Diagnosis not present

## 2020-04-10 DIAGNOSIS — Z85828 Personal history of other malignant neoplasm of skin: Secondary | ICD-10-CM | POA: Diagnosis not present

## 2020-04-10 DIAGNOSIS — L821 Other seborrheic keratosis: Secondary | ICD-10-CM | POA: Diagnosis not present

## 2020-05-06 ENCOUNTER — Ambulatory Visit: Payer: Medicare Other

## 2020-05-09 ENCOUNTER — Ambulatory Visit: Payer: Medicare Other | Attending: Internal Medicine

## 2020-05-09 DIAGNOSIS — Z23 Encounter for immunization: Secondary | ICD-10-CM

## 2020-05-09 NOTE — Progress Notes (Signed)
   Covid-19 Vaccination Clinic  Name:  CHANSE KAGEL    MRN: [id number] DOB: 1936-06-26  05/09/2020  Mr. Beaulieu was observed post Covid-19 immunization for 15 minutes without incident. He was provided with Vaccine Information Sheet and instruction to access the V-Safe system.   Mr. Vanecek was instructed to call 911 with any severe reactions post vaccine: Marland Kitchen Difficulty breathing  . Swelling of face and throat  . A fast heartbeat  . A bad rash all over body  . Dizziness and weakness

## 2020-05-15 DIAGNOSIS — Z23 Encounter for immunization: Secondary | ICD-10-CM | POA: Diagnosis not present

## 2020-09-11 DIAGNOSIS — E663 Overweight: Secondary | ICD-10-CM | POA: Diagnosis not present

## 2020-09-11 DIAGNOSIS — M542 Cervicalgia: Secondary | ICD-10-CM | POA: Diagnosis not present

## 2020-09-11 DIAGNOSIS — I10 Essential (primary) hypertension: Secondary | ICD-10-CM | POA: Diagnosis not present

## 2020-09-11 DIAGNOSIS — E1169 Type 2 diabetes mellitus with other specified complication: Secondary | ICD-10-CM | POA: Diagnosis not present

## 2020-09-11 DIAGNOSIS — M5416 Radiculopathy, lumbar region: Secondary | ICD-10-CM | POA: Diagnosis not present

## 2020-11-06 DIAGNOSIS — M545 Low back pain, unspecified: Secondary | ICD-10-CM | POA: Diagnosis not present

## 2020-11-06 DIAGNOSIS — M542 Cervicalgia: Secondary | ICD-10-CM | POA: Diagnosis not present

## 2020-11-20 DIAGNOSIS — M542 Cervicalgia: Secondary | ICD-10-CM | POA: Diagnosis not present

## 2020-11-20 DIAGNOSIS — M545 Low back pain, unspecified: Secondary | ICD-10-CM | POA: Diagnosis not present

## 2020-11-22 DIAGNOSIS — M545 Low back pain, unspecified: Secondary | ICD-10-CM | POA: Diagnosis not present

## 2020-11-22 DIAGNOSIS — M542 Cervicalgia: Secondary | ICD-10-CM | POA: Diagnosis not present

## 2020-11-27 DIAGNOSIS — M545 Low back pain, unspecified: Secondary | ICD-10-CM | POA: Diagnosis not present

## 2020-11-27 DIAGNOSIS — M542 Cervicalgia: Secondary | ICD-10-CM | POA: Diagnosis not present

## 2020-11-29 DIAGNOSIS — M545 Low back pain, unspecified: Secondary | ICD-10-CM | POA: Diagnosis not present

## 2020-11-29 DIAGNOSIS — M542 Cervicalgia: Secondary | ICD-10-CM | POA: Diagnosis not present

## 2020-12-02 DIAGNOSIS — M545 Low back pain, unspecified: Secondary | ICD-10-CM | POA: Diagnosis not present

## 2020-12-02 DIAGNOSIS — M542 Cervicalgia: Secondary | ICD-10-CM | POA: Diagnosis not present

## 2020-12-04 DIAGNOSIS — M545 Low back pain, unspecified: Secondary | ICD-10-CM | POA: Diagnosis not present

## 2020-12-04 DIAGNOSIS — M542 Cervicalgia: Secondary | ICD-10-CM | POA: Diagnosis not present

## 2020-12-09 DIAGNOSIS — M545 Low back pain, unspecified: Secondary | ICD-10-CM | POA: Diagnosis not present

## 2020-12-09 DIAGNOSIS — M542 Cervicalgia: Secondary | ICD-10-CM | POA: Diagnosis not present

## 2020-12-17 DIAGNOSIS — M542 Cervicalgia: Secondary | ICD-10-CM | POA: Diagnosis not present

## 2020-12-17 DIAGNOSIS — M545 Low back pain, unspecified: Secondary | ICD-10-CM | POA: Diagnosis not present

## 2020-12-20 DIAGNOSIS — M542 Cervicalgia: Secondary | ICD-10-CM | POA: Diagnosis not present

## 2020-12-20 DIAGNOSIS — M545 Low back pain, unspecified: Secondary | ICD-10-CM | POA: Diagnosis not present

## 2020-12-23 DIAGNOSIS — H02831 Dermatochalasis of right upper eyelid: Secondary | ICD-10-CM | POA: Diagnosis not present

## 2020-12-23 DIAGNOSIS — H04123 Dry eye syndrome of bilateral lacrimal glands: Secondary | ICD-10-CM | POA: Diagnosis not present

## 2020-12-23 DIAGNOSIS — H35372 Puckering of macula, left eye: Secondary | ICD-10-CM | POA: Diagnosis not present

## 2020-12-24 DIAGNOSIS — M545 Low back pain, unspecified: Secondary | ICD-10-CM | POA: Diagnosis not present

## 2020-12-24 DIAGNOSIS — M542 Cervicalgia: Secondary | ICD-10-CM | POA: Diagnosis not present

## 2020-12-27 DIAGNOSIS — M542 Cervicalgia: Secondary | ICD-10-CM | POA: Diagnosis not present

## 2020-12-27 DIAGNOSIS — M545 Low back pain, unspecified: Secondary | ICD-10-CM | POA: Diagnosis not present

## 2021-01-06 DIAGNOSIS — I10 Essential (primary) hypertension: Secondary | ICD-10-CM | POA: Diagnosis not present

## 2021-01-06 DIAGNOSIS — E1169 Type 2 diabetes mellitus with other specified complication: Secondary | ICD-10-CM | POA: Diagnosis not present

## 2021-01-06 DIAGNOSIS — E663 Overweight: Secondary | ICD-10-CM | POA: Diagnosis not present

## 2021-01-15 ENCOUNTER — Other Ambulatory Visit (HOSPITAL_BASED_OUTPATIENT_CLINIC_OR_DEPARTMENT_OTHER): Payer: Self-pay

## 2021-01-15 ENCOUNTER — Ambulatory Visit: Payer: Medicare Other | Attending: Internal Medicine

## 2021-01-15 ENCOUNTER — Other Ambulatory Visit: Payer: Self-pay

## 2021-01-15 DIAGNOSIS — Z23 Encounter for immunization: Secondary | ICD-10-CM

## 2021-01-15 MED ORDER — PFIZER-BIONT COVID-19 VAC-TRIS 30 MCG/0.3ML IM SUSP
INTRAMUSCULAR | 0 refills | Status: DC
  Filled 2021-01-15: qty 0.3, 1d supply, fill #0

## 2021-01-15 NOTE — Progress Notes (Signed)
   Covid-19 Vaccination Clinic  Name:  Nathaniel Mcclain    MRN: [id number] DOB: 1936/07/05  01/15/2021  Nathaniel Mcclain was observed post Covid-19 immunization for 15 minutes without incident. He was provided with Vaccine Information Sheet and instruction to access the V-Safe system.   Nathaniel Mcclain was instructed to call 911 with any severe reactions post vaccine: Difficulty breathing  Swelling of face and throat  A fast heartbeat  A bad rash all over body  Dizziness and weakness   Immunizations Administered     Name Date Dose VIS Date Route   PFIZER Comrnaty(Gray TOP) Covid-19 Vaccine 01/15/2021 12:50 PM 0.3 mL 07/11/2020 Intramuscular   Manufacturer: [place]   Lot: [id number]   [place]: [phone]

## 2021-01-23 DIAGNOSIS — D2371 Other benign neoplasm of skin of right lower limb, including hip: Secondary | ICD-10-CM | POA: Diagnosis not present

## 2021-01-23 DIAGNOSIS — D225 Melanocytic nevi of trunk: Secondary | ICD-10-CM | POA: Diagnosis not present

## 2021-01-23 DIAGNOSIS — L814 Other melanin hyperpigmentation: Secondary | ICD-10-CM | POA: Diagnosis not present

## 2021-01-23 DIAGNOSIS — L821 Other seborrheic keratosis: Secondary | ICD-10-CM | POA: Diagnosis not present

## 2021-01-23 DIAGNOSIS — D1801 Hemangioma of skin and subcutaneous tissue: Secondary | ICD-10-CM | POA: Diagnosis not present

## 2021-01-23 DIAGNOSIS — Z85828 Personal history of other malignant neoplasm of skin: Secondary | ICD-10-CM | POA: Diagnosis not present

## 2021-01-23 DIAGNOSIS — L570 Actinic keratosis: Secondary | ICD-10-CM | POA: Diagnosis not present

## 2021-04-29 ENCOUNTER — Ambulatory Visit: Payer: Medicare Other

## 2021-05-06 ENCOUNTER — Other Ambulatory Visit (HOSPITAL_BASED_OUTPATIENT_CLINIC_OR_DEPARTMENT_OTHER): Payer: Self-pay

## 2021-05-06 ENCOUNTER — Other Ambulatory Visit: Payer: Self-pay

## 2021-05-06 ENCOUNTER — Ambulatory Visit: Payer: Medicare Other | Attending: Internal Medicine

## 2021-05-06 DIAGNOSIS — Z23 Encounter for immunization: Secondary | ICD-10-CM

## 2021-05-06 MED ORDER — PFIZER COVID-19 VAC BIVALENT 30 MCG/0.3ML IM SUSP
INTRAMUSCULAR | 0 refills | Status: DC
  Filled 2021-05-06: qty 0.3, 1d supply, fill #0

## 2021-05-06 MED ORDER — FLUAD QUADRIVALENT 0.5 ML IM PRSY
PREFILLED_SYRINGE | INTRAMUSCULAR | 0 refills | Status: DC
  Filled 2021-05-06: qty 0.5, 1d supply, fill #0

## 2021-05-06 NOTE — Progress Notes (Signed)
   Covid-19 Vaccination Clinic  Name:  Nathaniel Mcclain    MRN: [id number] DOB: 1936/07/07  05/06/2021  Mr. Nathaniel Mcclain was observed post Covid-19 immunization for 15 minutes without incident. He was provided with Vaccine Information Sheet and instruction to access the V-Safe system.   Mr. Nathaniel Mcclain was instructed to call 911 with any severe reactions post vaccine: Difficulty breathing  Swelling of face and throat  A fast heartbeat  A bad rash all over body  Dizziness and weakness

## 2021-05-09 DIAGNOSIS — Z23 Encounter for immunization: Secondary | ICD-10-CM | POA: Diagnosis not present

## 2021-05-28 DIAGNOSIS — I10 Essential (primary) hypertension: Secondary | ICD-10-CM | POA: Diagnosis not present

## 2021-05-28 DIAGNOSIS — E039 Hypothyroidism, unspecified: Secondary | ICD-10-CM | POA: Diagnosis not present

## 2021-05-28 DIAGNOSIS — E663 Overweight: Secondary | ICD-10-CM | POA: Diagnosis not present

## 2021-05-28 DIAGNOSIS — M5136 Other intervertebral disc degeneration, lumbar region: Secondary | ICD-10-CM | POA: Diagnosis not present

## 2021-05-28 DIAGNOSIS — E1169 Type 2 diabetes mellitus with other specified complication: Secondary | ICD-10-CM | POA: Diagnosis not present

## 2021-05-28 DIAGNOSIS — E785 Hyperlipidemia, unspecified: Secondary | ICD-10-CM | POA: Diagnosis not present

## 2021-05-28 DIAGNOSIS — M1990 Unspecified osteoarthritis, unspecified site: Secondary | ICD-10-CM | POA: Diagnosis not present

## 2021-07-07 DIAGNOSIS — R531 Weakness: Secondary | ICD-10-CM | POA: Diagnosis not present

## 2021-07-07 DIAGNOSIS — B027 Disseminated zoster: Secondary | ICD-10-CM | POA: Diagnosis not present

## 2021-11-05 DIAGNOSIS — M1990 Unspecified osteoarthritis, unspecified site: Secondary | ICD-10-CM | POA: Diagnosis not present

## 2021-11-05 DIAGNOSIS — E1169 Type 2 diabetes mellitus with other specified complication: Secondary | ICD-10-CM | POA: Diagnosis not present

## 2021-11-05 DIAGNOSIS — E663 Overweight: Secondary | ICD-10-CM | POA: Diagnosis not present

## 2021-11-05 DIAGNOSIS — I10 Essential (primary) hypertension: Secondary | ICD-10-CM | POA: Diagnosis not present

## 2021-12-01 DIAGNOSIS — I639 Cerebral infarction, unspecified: Secondary | ICD-10-CM

## 2021-12-01 HISTORY — DX: Cerebral infarction, unspecified: I63.9

## 2021-12-05 ENCOUNTER — Other Ambulatory Visit: Payer: Self-pay | Admitting: Internal Medicine

## 2021-12-05 ENCOUNTER — Ambulatory Visit (HOSPITAL_COMMUNITY)
Admission: RE | Admit: 2021-12-05 | Discharge: 2021-12-05 | Disposition: A | Discharge status: Home / Self Care | Payer: Medicare Other | Source: Ambulatory Visit | Attending: Internal Medicine | Admitting: Internal Medicine

## 2021-12-05 ENCOUNTER — Other Ambulatory Visit (HOSPITAL_COMMUNITY): Payer: Self-pay | Admitting: Internal Medicine

## 2021-12-05 DIAGNOSIS — M1990 Unspecified osteoarthritis, unspecified site: Secondary | ICD-10-CM | POA: Diagnosis not present

## 2021-12-05 DIAGNOSIS — R531 Weakness: Secondary | ICD-10-CM | POA: Diagnosis not present

## 2021-12-05 DIAGNOSIS — I10 Essential (primary) hypertension: Secondary | ICD-10-CM | POA: Diagnosis not present

## 2021-12-05 DIAGNOSIS — E1169 Type 2 diabetes mellitus with other specified complication: Secondary | ICD-10-CM | POA: Diagnosis not present

## 2021-12-05 DIAGNOSIS — I6381 Other cerebral infarction due to occlusion or stenosis of small artery: Secondary | ICD-10-CM | POA: Diagnosis not present

## 2021-12-08 ENCOUNTER — Other Ambulatory Visit: Payer: Self-pay | Admitting: Internal Medicine

## 2021-12-08 ENCOUNTER — Other Ambulatory Visit (HOSPITAL_COMMUNITY): Payer: Self-pay | Admitting: Internal Medicine

## 2021-12-08 DIAGNOSIS — Z8673 Personal history of transient ischemic attack (TIA), and cerebral infarction without residual deficits: Secondary | ICD-10-CM

## 2021-12-08 DIAGNOSIS — R29898 Other symptoms and signs involving the musculoskeletal system: Secondary | ICD-10-CM

## 2021-12-09 ENCOUNTER — Inpatient Hospital Stay (HOSPITAL_COMMUNITY)
Admission: RE | Admit: 2021-12-09 | Discharge: 2021-12-09 | Disposition: A | Discharge status: Home / Self Care | Payer: Medicare Other | Source: Ambulatory Visit | Attending: Internal Medicine | Admitting: Internal Medicine

## 2021-12-09 ENCOUNTER — Inpatient Hospital Stay (HOSPITAL_COMMUNITY): Payer: Medicare Other

## 2021-12-09 ENCOUNTER — Inpatient Hospital Stay (HOSPITAL_COMMUNITY)
Admission: EM | Admit: 2021-12-09 | Discharge: 2021-12-11 | DRG: 066 | Disposition: A | Discharge status: Home / Self Care | Payer: Medicare Other | Attending: Internal Medicine | Admitting: Internal Medicine

## 2021-12-09 ENCOUNTER — Encounter (HOSPITAL_COMMUNITY): Payer: Self-pay

## 2021-12-09 ENCOUNTER — Other Ambulatory Visit: Payer: Self-pay

## 2021-12-09 DIAGNOSIS — Z79899 Other long term (current) drug therapy: Secondary | ICD-10-CM | POA: Diagnosis not present

## 2021-12-09 DIAGNOSIS — I672 Cerebral atherosclerosis: Secondary | ICD-10-CM | POA: Diagnosis not present

## 2021-12-09 DIAGNOSIS — I63231 Cerebral infarction due to unspecified occlusion or stenosis of right carotid arteries: Secondary | ICD-10-CM | POA: Diagnosis not present

## 2021-12-09 DIAGNOSIS — R29898 Other symptoms and signs involving the musculoskeletal system: Secondary | ICD-10-CM

## 2021-12-09 DIAGNOSIS — E119 Type 2 diabetes mellitus without complications: Secondary | ICD-10-CM | POA: Diagnosis present

## 2021-12-09 DIAGNOSIS — Z8249 Family history of ischemic heart disease and other diseases of the circulatory system: Secondary | ICD-10-CM

## 2021-12-09 DIAGNOSIS — G8324 Monoplegia of upper limb affecting left nondominant side: Secondary | ICD-10-CM | POA: Diagnosis present

## 2021-12-09 DIAGNOSIS — I63331 Cerebral infarction due to thrombosis of right posterior cerebral artery: Secondary | ICD-10-CM | POA: Diagnosis not present

## 2021-12-09 DIAGNOSIS — M1990 Unspecified osteoarthritis, unspecified site: Secondary | ICD-10-CM | POA: Diagnosis not present

## 2021-12-09 DIAGNOSIS — R29700 NIHSS score 0: Secondary | ICD-10-CM | POA: Diagnosis present

## 2021-12-09 DIAGNOSIS — R531 Weakness: Secondary | ICD-10-CM | POA: Diagnosis not present

## 2021-12-09 DIAGNOSIS — E7800 Pure hypercholesterolemia, unspecified: Secondary | ICD-10-CM

## 2021-12-09 DIAGNOSIS — K2100 Gastro-esophageal reflux disease with esophagitis, without bleeding: Secondary | ICD-10-CM

## 2021-12-09 DIAGNOSIS — E785 Hyperlipidemia, unspecified: Secondary | ICD-10-CM | POA: Diagnosis present

## 2021-12-09 DIAGNOSIS — Z833 Family history of diabetes mellitus: Secondary | ICD-10-CM

## 2021-12-09 DIAGNOSIS — I6503 Occlusion and stenosis of bilateral vertebral arteries: Secondary | ICD-10-CM | POA: Diagnosis not present

## 2021-12-09 DIAGNOSIS — K219 Gastro-esophageal reflux disease without esophagitis: Secondary | ICD-10-CM | POA: Diagnosis present

## 2021-12-09 DIAGNOSIS — M47812 Spondylosis without myelopathy or radiculopathy, cervical region: Secondary | ICD-10-CM | POA: Diagnosis not present

## 2021-12-09 DIAGNOSIS — I10 Essential (primary) hypertension: Secondary | ICD-10-CM | POA: Diagnosis present

## 2021-12-09 DIAGNOSIS — Z7982 Long term (current) use of aspirin: Secondary | ICD-10-CM | POA: Diagnosis not present

## 2021-12-09 DIAGNOSIS — E1169 Type 2 diabetes mellitus with other specified complication: Secondary | ICD-10-CM | POA: Diagnosis not present

## 2021-12-09 DIAGNOSIS — E038 Other specified hypothyroidism: Secondary | ICD-10-CM | POA: Diagnosis not present

## 2021-12-09 DIAGNOSIS — I639 Cerebral infarction, unspecified: Secondary | ICD-10-CM | POA: Diagnosis present

## 2021-12-09 DIAGNOSIS — Z86010 Personal history of colonic polyps: Secondary | ICD-10-CM

## 2021-12-09 DIAGNOSIS — Z79890 Hormone replacement therapy (postmenopausal): Secondary | ICD-10-CM | POA: Diagnosis not present

## 2021-12-09 DIAGNOSIS — E039 Hypothyroidism, unspecified: Secondary | ICD-10-CM | POA: Diagnosis present

## 2021-12-09 DIAGNOSIS — I63233 Cerebral infarction due to unspecified occlusion or stenosis of bilateral carotid arteries: Secondary | ICD-10-CM | POA: Diagnosis not present

## 2021-12-09 DIAGNOSIS — Z8673 Personal history of transient ischemic attack (TIA), and cerebral infarction without residual deficits: Secondary | ICD-10-CM

## 2021-12-09 DIAGNOSIS — E890 Postprocedural hypothyroidism: Secondary | ICD-10-CM | POA: Diagnosis present

## 2021-12-09 DIAGNOSIS — I6389 Other cerebral infarction: Secondary | ICD-10-CM | POA: Diagnosis not present

## 2021-12-09 LAB — PROTIME-INR
INR: 1 (ref 0.8–1.2)
Prothrombin Time: 12.7 seconds (ref 11.4–15.2)

## 2021-12-09 LAB — CBC WITH DIFFERENTIAL/PLATELET
Abs Immature Granulocytes: 0.02 10*3/uL (ref 0.00–0.07)
Basophils Absolute: 0.1 10*3/uL (ref 0.0–0.1)
Basophils Relative: 1 %
Eosinophils Absolute: 0.1 10*3/uL (ref 0.0–0.5)
Eosinophils Relative: 1 %
HCT: 44.3 % (ref 39.0–52.0)
Hemoglobin: 15.3 g/dL (ref 13.0–17.0)
Immature Granulocytes: 0 %
Lymphocytes Relative: 35 %
Lymphs Abs: 2.6 10*3/uL (ref 0.7–4.0)
MCH: 32.3 pg (ref 26.0–34.0)
MCHC: 34.5 g/dL (ref 30.0–36.0)
MCV: 93.5 fL (ref 80.0–100.0)
Monocytes Absolute: 0.6 10*3/uL (ref 0.1–1.0)
Monocytes Relative: 8 %
Neutro Abs: 4.2 10*3/uL (ref 1.7–7.7)
Neutrophils Relative %: 55 %
Platelets: 195 10*3/uL (ref 150–400)
RBC: 4.74 MIL/uL (ref 4.22–5.81)
RDW: 14.1 % (ref 11.5–15.5)
WBC: 7.6 10*3/uL (ref 4.0–10.5)
nRBC: 0 % (ref 0.0–0.2)

## 2021-12-09 LAB — URINALYSIS, ROUTINE W REFLEX MICROSCOPIC
Bacteria, UA: NONE SEEN
Bilirubin Urine: NEGATIVE
Glucose, UA: NEGATIVE mg/dL
Hgb urine dipstick: NEGATIVE
Ketones, ur: NEGATIVE mg/dL
Nitrite: NEGATIVE
Protein, ur: NEGATIVE mg/dL
Specific Gravity, Urine: 1.019 (ref 1.005–1.030)
pH: 7 (ref 5.0–8.0)

## 2021-12-09 LAB — CBC
HCT: 41.2 % (ref 39.0–52.0)
Hemoglobin: 14.3 g/dL (ref 13.0–17.0)
MCH: 32.3 pg (ref 26.0–34.0)
MCHC: 34.7 g/dL (ref 30.0–36.0)
MCV: 93 fL (ref 80.0–100.0)
Platelets: 171 10*3/uL (ref 150–400)
RBC: 4.43 MIL/uL (ref 4.22–5.81)
RDW: 13.9 % (ref 11.5–15.5)
WBC: 6.8 10*3/uL (ref 4.0–10.5)
nRBC: 0 % (ref 0.0–0.2)

## 2021-12-09 LAB — COMPREHENSIVE METABOLIC PANEL
ALT: 23 U/L (ref 0–44)
AST: 25 U/L (ref 15–41)
Albumin: 4.3 g/dL (ref 3.5–5.0)
Alkaline Phosphatase: 51 U/L (ref 38–126)
Anion gap: 9 (ref 5–15)
BUN: 18 mg/dL (ref 8–23)
CO2: 27 mmol/L (ref 22–32)
Calcium: 9.2 mg/dL (ref 8.9–10.3)
Chloride: 104 mmol/L (ref 98–111)
Creatinine, Ser: 1.08 mg/dL (ref 0.61–1.24)
GFR, Estimated: >60 mL/min (ref >60)
Glucose, Bld: 99 mg/dL (ref 70–99)
Potassium: 4.2 mmol/L (ref 3.5–5.1)
Sodium: 140 mmol/L (ref 135–145)
Total Bilirubin: 0.7 mg/dL (ref 0.3–1.2)
Total Protein: 7.3 g/dL (ref 6.5–8.1)

## 2021-12-09 LAB — APTT: aPTT: 27 seconds (ref 24–36)

## 2021-12-09 LAB — CREATININE, SERUM
Creatinine, Ser: 1.19 mg/dL (ref 0.61–1.24)
GFR, Estimated: >60 mL/min (ref >60)

## 2021-12-09 MED ORDER — SODIUM CHLORIDE 0.9 % IV SOLN: INTRAVENOUS | Status: DC

## 2021-12-09 MED ORDER — PRAVASTATIN SODIUM 20 MG PO TABS: 80.0000 mg | ORAL_TABLET | Freq: Every day | ORAL | Status: DC

## 2021-12-09 MED ORDER — ASPIRIN 81 MG PO CHEW
81.0000 mg | CHEWABLE_TABLET | Freq: Every day | ORAL | Status: DC
  Administered 2021-12-09 – 2021-12-11 (×3): 81 mg via ORAL
  Filled 2021-12-09 (×3): qty 1

## 2021-12-09 MED ORDER — AMLODIPINE BESYLATE 5 MG PO TABS: 5.0000 mg | ORAL_TABLET | Freq: Two times a day (BID) | ORAL | Status: DC

## 2021-12-09 MED ORDER — HYDRALAZINE HCL 20 MG/ML IJ SOLN: 10.0000 mg | Freq: Four times a day (QID) | INTRAMUSCULAR | Status: DC | PRN

## 2021-12-09 MED ORDER — ACETAMINOPHEN 650 MG RE SUPP: 650.0000 mg | RECTAL | Status: DC | PRN

## 2021-12-09 MED ORDER — PANTOPRAZOLE SODIUM 40 MG PO TBEC: 40.0000 mg | DELAYED_RELEASE_TABLET | Freq: Every day | ORAL | Status: DC

## 2021-12-09 MED ORDER — STROKE: EARLY STAGES OF RECOVERY BOOK
Freq: Once | Status: AC
  Filled 2021-12-09 (×2): qty 1

## 2021-12-09 MED ORDER — LEVOTHYROXINE SODIUM 150 MCG PO TABS
150.0000 ug | ORAL_TABLET | Freq: Every day | ORAL | Status: DC
  Administered 2021-12-10 – 2021-12-11 (×2): 150 ug via ORAL
  Filled 2021-12-09 (×2): qty 1

## 2021-12-09 MED ORDER — PRAVASTATIN SODIUM 40 MG PO TABS
80.0000 mg | ORAL_TABLET | Freq: Every day | ORAL | Status: DC
  Administered 2021-12-10: 80 mg via ORAL
  Filled 2021-12-09: qty 4

## 2021-12-09 MED ORDER — BISACODYL 5 MG PO TBEC
10.0000 mg | DELAYED_RELEASE_TABLET | ORAL | Status: DC
  Administered 2021-12-11: 10 mg via ORAL
  Filled 2021-12-09: qty 2

## 2021-12-09 MED ORDER — ENOXAPARIN SODIUM 40 MG/0.4ML IJ SOSY
40.0000 mg | PREFILLED_SYRINGE | INTRAMUSCULAR | Status: DC
  Administered 2021-12-09 – 2021-12-10 (×2): 40 mg via SUBCUTANEOUS
  Filled 2021-12-09 (×2): qty 0.4

## 2021-12-09 MED ORDER — IOHEXOL 350 MG/ML SOLN
75.0000 mL | Freq: Once | INTRAVENOUS | Status: AC | PRN
  Administered 2021-12-09: 75 mL via INTRAVENOUS

## 2021-12-09 MED ORDER — BENAZEPRIL HCL 20 MG PO TABS
10.0000 mg | ORAL_TABLET | Freq: Two times a day (BID) | ORAL | Status: DC
  Administered 2021-12-09 – 2021-12-11 (×4): 10 mg via ORAL
  Filled 2021-12-09 (×4): qty 1

## 2021-12-09 MED ORDER — CLOPIDOGREL BISULFATE 75 MG PO TABS
75.0000 mg | ORAL_TABLET | Freq: Every day | ORAL | Status: DC
  Administered 2021-12-09 – 2021-12-11 (×3): 75 mg via ORAL
  Filled 2021-12-09 (×3): qty 1

## 2021-12-09 MED ORDER — ACETAMINOPHEN 160 MG/5ML PO SOLN: 650.0000 mg | ORAL | Status: DC | PRN

## 2021-12-09 MED ORDER — BISACODYL 5 MG PO TBEC: 10.0000 mg | DELAYED_RELEASE_TABLET | ORAL | Status: DC

## 2021-12-09 MED ORDER — PANTOPRAZOLE SODIUM 40 MG PO TBEC
40.0000 mg | DELAYED_RELEASE_TABLET | Freq: Every day | ORAL | Status: DC
  Administered 2021-12-10 – 2021-12-11 (×2): 40 mg via ORAL
  Filled 2021-12-09 (×2): qty 1

## 2021-12-09 MED ORDER — GADOBUTROL 1 MMOL/ML IV SOLN
8.0000 mL | Freq: Once | INTRAVENOUS | Status: AC | PRN
  Administered 2021-12-09: 8 mL via INTRAVENOUS

## 2021-12-09 MED ORDER — STROKE: EARLY STAGES OF RECOVERY BOOK
Freq: Once | Status: DC
  Filled 2021-12-09: qty 1

## 2021-12-09 MED ORDER — AMLODIPINE BESYLATE 10 MG PO TABS
10.0000 mg | ORAL_TABLET | Freq: Every day | ORAL | Status: DC
  Administered 2021-12-10 – 2021-12-11 (×2): 10 mg via ORAL
  Filled 2021-12-09: qty 1
  Filled 2021-12-09: qty 2

## 2021-12-09 MED ORDER — ACETAMINOPHEN 325 MG PO TABS: 650.0000 mg | ORAL_TABLET | ORAL | Status: DC | PRN

## 2021-12-09 MED ORDER — SODIUM CHLORIDE 0.9% FLUSH
3.0000 mL | Freq: Once | INTRAVENOUS | Status: AC
  Administered 2021-12-09: 3 mL via INTRAVENOUS

## 2021-12-09 NOTE — ED Triage Notes (Signed)
Pt reports left hand weakness that began Thursday. Pt had CT Friday which was negative and had an MRI today that was concerning. Pt was sent here for further evaluation.  ?

## 2021-12-09 NOTE — ED Provider Notes (Signed)
?[facility] DEPT ?Provider Note ? ? ?CSN: [id number] ?Arrival date & time: 12/09/21  1422 ? ?  ? ?History ? ?Chief Complaint  ?Patient presents with  ? Weakness  ? ? ?Nathaniel Mcclain is a 86 y.o. male who presents the emergency department complaining of left hand weakness for 5 days.  Patient reports dropping things with his left hand. Patient had an outpatient CT 4 days ago which was negative for acute stroke.  He had a follow-up MRI performed today that was concerning for several small infarcts, and he was sent to the ER for further evaluation.  He is not complaining of weakness at this time, and stroke screen in triage was negative. ? ? ?Weakness ?Associated symptoms: no chest pain, no dizziness, no headaches and no shortness of breath   ? ?  ? ?Home Medications ?Prior to Admission medications   ?Medication Sig Start Date End Date Taking? Authorizing Provider  ?amLODipine (NORVASC) 5 MG tablet Take 5 mg by mouth 2 (two) times daily.    [provider]  ?aspirin 325 MG EC tablet Take 325 mg by mouth daily.    [provider]  ?benazepril (LOTENSIN) 10 MG tablet Take 10 mg by mouth 2 (two) times daily.    [provider]  ?bisacodyl (DULCOLAX) 5 MG EC tablet Take 10 mg by mouth every other day.    [provider]  ?COVID-19 mRNA bivalent vaccine, Pfizer, (PFIZER COVID-19 VAC BIVALENT) injection Inject into the muscle. 05/06/21     ?COVID-19 mRNA Vac-TriS, Pfizer, (PFIZER-BIONT COVID-19 VAC-TRIS) SUSP injection Inject into the muscle. 01/15/21   Carlyle Basques, MD  ?esomeprazole (NEXIUM) 40 MG capsule Take 40 mg by mouth daily at 12 noon.    [provider]  ?influenza vaccine adjuvanted (FLUAD QUADRIVALENT) 0.5 ML injection Inject into the muscle. 05/06/21     ?levothyroxine (SYNTHROID, LEVOTHROID) 137 MCG tablet Take 137 mcg by mouth daily before breakfast.    [provider]  ?pravastatin (PRAVACHOL) 80 MG tablet Take 80 mg by mouth  daily.    [provider]  ?   ? ?Allergies    ?Patient has no known allergies.   ? ?Review of Systems   ?Review of Systems  ?Eyes:  Negative for visual disturbance.  ?Respiratory:  Negative for shortness of breath.   ?Cardiovascular:  Negative for chest pain.  ?Musculoskeletal:  Negative for gait problem.  ?Neurological:  Positive for weakness. Negative for dizziness, facial asymmetry, speech difficulty, light-headedness, numbness and headaches.  ?All other systems reviewed and are negative. ? ?Physical Exam ?Updated Vital Signs ?BP (!) 148/83 (BP Location: Left Arm)   Pulse (!) 58   Temp 99.3 ?F (37.4 ?C) (Oral)   Resp 15   SpO2 95%  ?Physical Exam ?Vitals and nursing note reviewed.  ?Constitutional:   ?   Appearance: Normal appearance.  ?HENT:  ?   Head: Normocephalic and atraumatic.  ?Eyes:  ?   Conjunctiva/sclera: Conjunctivae normal.  ?Cardiovascular:  ?   Rate and Rhythm: Normal rate and regular rhythm.  ?Pulmonary:  ?   Effort: Pulmonary effort is normal. No respiratory distress.  ?   Breath sounds: Normal breath sounds.  ?Abdominal:  ?   General: There is no distension.  ?   Palpations: Abdomen is soft.  ?   Tenderness: There is no abdominal tenderness.  ?Skin: ?   General: Skin is warm and dry.  ?Neurological:  ?   General: No focal deficit present.  ?  Mental Status: He is alert.  ?   Comments: Neuro: Speech is clear, able to follow commands. CN III-XII intact grossly intact. PERRLA. EOMI. Sensation intact throughout. Patient complaining of left hand weakness although he has 5/5 strength in all extremities.   ? ? ?ED Results / Procedures / Treatments   ?Labs ?(all labs ordered are listed, but only abnormal results are displayed) ?Labs Reviewed  ?PROTIME-INR  ?APTT  ?COMPREHENSIVE METABOLIC PANEL  ?CBC WITH DIFFERENTIAL/PLATELET  ? ? ?EKG ?None ? ?Radiology ?MR BRAIN W WO CONTRAST ? ?Result Date: 12/09/2021 ?CLINICAL DATA:  Bent over several days ago and struck forehead on table. Left hand  weakness. EXAM: MRI HEAD WITHOUT AND WITH CONTRAST TECHNIQUE: Multiplanar, multiecho pulse sequences of the brain and surrounding structures were obtained without and with intravenous contrast. CONTRAST:  34m GADAVIST GADOBUTROL 1 MMOL/ML IV SOLN COMPARISON:  Head CT 12/05/2021 FINDINGS: Brain: Diffusion imaging shows a cluster of small acute infarctions affecting the right posterior frontal cortex, possibly the precentral gyrus, and a few foci of the deep white matter in the forceps major region on the right. No large confluent infarction. There chronic small-vessel ischemic changes of the pons. No focal cerebellar insult. Cerebral hemispheres show old small vessel infarctions of the thalami, basal ganglia and throughout the cerebral hemispheric white matter. There is an old right posterior parietooccipital junction cortical infarction. No mass lesion, hemorrhage, hydrocephalus or extra-axial collection. After contrast administration, there is minimal enhancement at the site of the right posterior frontal infarctions. Vascular: Major vessels at the base of the brain show flow. Skull and upper cervical spine: Negative Sinuses/Orbits: Clear/normal Other: None IMPRESSION: Few small acute infarctions at the right posterior frontal vertex, probably affecting the pre central gyrus. No mass effect or hemorrhage. Minimal enhancement at those sites, often seen in acute to subacute infarctions. Few small acute infarctions also affecting the right parietal deep white matter in the forceps major region. No large infarction. Extensive chronic small-vessel ischemic changes elsewhere throughout the brain as outlined above. Old infarction at the right posterior parietal/occipital junction. These results will be called to the ordering clinician or representative by the Radiologist Assistant, and communication documented in the PACS or [employer] Electronically Signed   By: MNelson ChimesM.D.   On: 12/09/2021 13:50    ? ?Procedures ?Procedures  ? ? ?Medications Ordered in ED ?Medications  ?sodium chloride flush (NS) 0.9 % injection 3 mL (has no administration in time range)  ?aspirin chewable tablet 81 mg (has no administration in time range)  ?clopidogrel (PLAVIX) tablet 75 mg (has no administration in time range)  ? ? ?ED Course/ Medical Decision Making/ A&P ?  ?                        ?Medical Decision Making ?Amount and/or Complexity of Data Reviewed ?Labs: ordered. ? ?This patient presents to the ED for concern of possible stroke, this involves an extensive number of treatment options, and is a complaint that carries with it a high risk of complications and morbidity.  ? ?Past Medical History / Co-morbidities / Social History: ?Arthritis, hyperlipidemia, TIA in 1980, GERD, hypertension, diabetes ? ?Additional history: ?Chart reviewed. Pertinent results include: CT head performed on 5/5 that showed small vessel chronic ischemic changes, and old lacunar infarct in the left basal ganglia.  No acute stroke.  Outpatient MRI performed this afternoon showed small acute infarctions at the right posterior frontal vertex, right parietal deep white matter, and  extensive chronic small vessel ischemic changes.  No large infarct. ? ?Physical Exam: ?Physical exam performed. The pertinent findings include: Negative stroke scale.  Normal vital signs.  Patient in no acute distress. ? ?Lab Tests: ?I ordered, and personally interpreted labs.  The pertinent results include: No leukocytosis, normal hemoglobin. ?  ?Imaging Studies: ?I reviewed outpatient imaging with pertinent results as above. ?  ?Consultations Obtained: ?I requested consultation with the neurologist Dr. Quinn Axe,  and discussed lab and imaging findings as well as pertinent plan - they recommend: admission to [facility] and start aspirin and plavix.  ? ?I consulted hospitalist Dr. Tana Coast who will admit to [facility].   ?  ?Disposition: ?After consideration of the diagnostic results  and the patients response to treatment, I feel that patient is requiring medical admission for acute CVA.  ? ?I discussed this case with my attending physician Dr. Dina Rich who cosigned this note including patient's presenting

## 2021-12-09 NOTE — H&P (Signed)
[facility] ? ?History and Physical  ? ? ?Patient: Nathaniel Mcclain GEX:[id number] DOB: 1936/06/30 ?DOA: 12/09/2021 ?DOS: the patient was seen and examined on 12/09/2021 ?PCP: Burnard Bunting, MD  ?Patient coming from: Home ? ?Chief Complaint:  ?Chief Complaint  ?Patient presents with  ? Weakness  ? ?HPI: Nathaniel Mcclain is a 86 y.o. male with medical history significant of prior TIA/CVA on aspirin, HTN, hyperlipidemia, hypothyroid, GERD, presented to ED with left hand weakness for 5 days.  Patient reported that the symptoms started on Thursday when he noticed weakness in his left hand and was dropping things.  On Friday a.m., patient went to see his PCP and had outpatient CT done which was negative for acute stroke.  Patient however continued to have the symptoms hence had a follow-up MRI performed outpatient today which showed small acute infarction in the right posterior frontal vertex affecting the precentral gyrus, few small acute infarction also affecting right parietal deep white matter in the forceps major region, no large infarction. ?Patient otherwise denied any numbness tingling, lower extremity weakness, dysarthria, headaches or visual changes.  Patient reports that left hand weakness is improving however not back to baseline yet ? ?In ED temp 99.3 ?F, respiratory rate 15, pulse 62, BP 161/84 ?BMET, CBC unremarkable ?EDP discussed with neurology and was recommended transfer to [facility] for further stroke work-up and evaluation. ? ?Review of Systems: As mentioned in the history of present illness. All other systems reviewed and are negative. ?Past Medical History:  ?Diagnosis Date  ? Arthritis   ? Colon polyps   ? Diabetes ([place])   ? GERD (gastroesophageal reflux disease)   ? Hyperlipidemia   ? Hypertension   ? Hypothyroid   ? Osteoarthritis   ? TIA (transient ischemic attack) 1980  ? ?Past Surgical History:  ?Procedure Laterality Date  ? THYROIDECTOMY  2008  ? ?Social History:  reports that he has never smoked. He has never used  smokeless tobacco. He reports that he does not drink alcohol and does not use drugs. ? ?No Known Allergies ? ?Family History  ?Problem Relation Age of Onset  ? Diabetes Mother   ? Heart disease Mother   ? Colon cancer Neg Hx   ? ? ?Prior to Admission medications   ?Medication Sig Start Date End Date Taking? Authorizing Provider  ?amLODipine (NORVASC) 5 MG tablet Take 5 mg by mouth 2 (two) times daily.    [provider]  ?aspirin 325 MG EC tablet Take 325 mg by mouth daily.    [provider]  ?benazepril (LOTENSIN) 10 MG tablet Take 10 mg by mouth 2 (two) times daily.    [provider]  ?bisacodyl (DULCOLAX) 5 MG EC tablet Take 10 mg by mouth every other day.    [provider]  ?COVID-19 mRNA bivalent vaccine, Pfizer, (PFIZER COVID-19 VAC BIVALENT) injection Inject into the muscle. 05/06/21     ?COVID-19 mRNA Vac-TriS, Pfizer, (PFIZER-BIONT COVID-19 VAC-TRIS) SUSP injection Inject into the muscle. 01/15/21   Carlyle Basques, MD  ?esomeprazole (NEXIUM) 40 MG capsule Take 40 mg by mouth daily at 12 noon.    [provider]  ?influenza vaccine adjuvanted (FLUAD QUADRIVALENT) 0.5 ML injection Inject into the muscle. 05/06/21     ?levothyroxine (SYNTHROID, LEVOTHROID) 137 MCG tablet Take 137 mcg by mouth daily before breakfast.    [provider]  ?pravastatin (PRAVACHOL) 80 MG tablet Take 80 mg by mouth daily.    [provider]  ? ? ?Physical Exam: ?  Vitals:  ? 12/09/21 1427 12/09/21 1549  ?BP: (!) 147/93 (!) 148/83  ?Pulse: 84 (!) 58  ?Resp: 14 15  ?Temp: 99.3 ?F (37.4 ?C) 99.3 ?F (37.4 ?C)  ?TempSrc: Oral Oral  ?SpO2: 97% 95%  ? ?Physical Exam ?General: Alert and oriented x 3, NAD, no dysarthria ?Cardiovascular: S1 S2 clear, RRR. No pedal edema b/l ?Respiratory: CTAB, no wheezing, rales or rhonchi ?Gastrointestinal: Soft, nontender, nondistended, NBS ?Ext: no pedal edema bilaterally ?Neuro: Cr N's II-XII intact, strength 5/5 RUE, RLE, LLE, Noted left hand  weakness still persistent ?Skin: No rashes ?Psych: Normal affect and demeanor, alert and oriented x3  ? ? ? ?Data Reviewed: ? ?  Latest Ref Rng & Units 12/09/2021  ?  2:40 PM  ?CBC  ?WBC 4.0 - 10.5 K/uL 7.6    ?Hemoglobin 13.0 - 17.0 g/dL 15.3    ?Hematocrit 39.0 - 52.0 % 44.3    ?Platelets 150 - 400 K/uL 195    ?  ? ?  Latest Ref Rng & Units 12/09/2021  ?  2:40 PM  ?BMP  ?Glucose 70 - 99 mg/dL 99    ?BUN 8 - 23 mg/dL 18    ?Creatinine 0.61 - 1.24 mg/dL 1.08    ?Sodium 135 - 145 mmol/L 140    ?Potassium 3.5 - 5.1 mmol/L 4.2    ?Chloride 98 - 111 mmol/L 104    ?CO2 22 - 32 mmol/L 27    ?Calcium 8.9 - 10.3 mg/dL 9.2    ?  ?EKG pending ? ?Assessment and Plan: ? ?Acute CVA with left hand weakness ?-Symptoms for last 5 days, MRI positive for small acute infarction of the right posterior frontal cortex affecting the precentral gyrus, few small acute infarction affecting the right parietal deep white matter in the forceps major region, no large infarction ?-Will obtain CT angiogram head and neck, 2D echo ?-Patient takes aspirin daily at home, neurology consulted by EDP, placed on aspirin 81 mg daily, Plavix 75 mg daily ?-Obtain lipid panel, hemoglobin A1c, EKG ?-Obtain PT OT, SLP evaluation ?-Continue pravastatin 80 mg daily ? ?Essential hypertension ?-BP somewhat elevated, will resume amlodipine, Lotensin ?-IV hydralazine as needed with parameters ? ?Hyperlipidemia ?-Obtain lipid panel, continue pravastatin 80 mg daily ? ?Hypothyroidism ?-Continue Synthroid 137 mcg daily, obtain TSH ? ?GERD ?-Continue Protonix ? ? ? Advance Care Planning:   Code Status: Discussed with the patient, opted to be full CODE STATUS ? ?Consults: Neurology ? ?Family Communication: Discussed with patient's son at the bedside ? ?Severity of Illness: ?The appropriate patient status for this patient is INPATIENT. Inpatient status is judged to be reasonable and necessary in order to provide the required intensity of service to ensure the patient's safety.  The patient's presenting symptoms, physical exam findings, and initial radiographic and laboratory data in the context of their chronic comorbidities is felt to place them at high risk for further clinical deterioration. Furthermore, it is not anticipated that the patient will be medically stable for discharge from the hospital within 2 midnights of admission.  ? ?* I certify that at the point of admission it is my clinical judgment that the patient will require inpatient hospital care spanning beyond 2 midnights from the point of admission due to high intensity of service, high risk for further deterioration and high frequency of surveillance required.* ? ?Author: ?Estill Cotta, MD ?12/09/2021 4:41 PM ? ?For on call review [email or web address].  ?

## 2021-12-10 ENCOUNTER — Inpatient Hospital Stay (HOSPITAL_COMMUNITY): Payer: Medicare Other

## 2021-12-10 DIAGNOSIS — I6389 Other cerebral infarction: Secondary | ICD-10-CM

## 2021-12-10 DIAGNOSIS — I639 Cerebral infarction, unspecified: Secondary | ICD-10-CM | POA: Diagnosis not present

## 2021-12-10 LAB — ECHOCARDIOGRAM COMPLETE
AR max vel: 1.38 cm2
AV Area VTI: 1.43 cm2
AV Area mean vel: 1.55 cm2
AV Mean grad: 14 mmHg
AV Peak grad: 27 mmHg
Ao pk vel: 2.6 m/s
Area-P 1/2: 2.45 cm2
S' Lateral: 2.4 cm

## 2021-12-10 LAB — LIPID PANEL
Cholesterol: 201 mg/dL — ABNORMAL HIGH (ref 0–200)
HDL: 33 mg/dL — ABNORMAL LOW (ref >40)
LDL Cholesterol: 96 mg/dL (ref 0–99)
Total CHOL/HDL Ratio: 6.1 RATIO
Triglycerides: 360 mg/dL — ABNORMAL HIGH (ref <150)
VLDL: 72 mg/dL — ABNORMAL HIGH (ref 0–40)

## 2021-12-10 LAB — HEMOGLOBIN A1C
Hgb A1c MFr Bld: 6.3 % — ABNORMAL HIGH (ref 4.8–5.6)
Mean Plasma Glucose: 134.11 mg/dL

## 2021-12-10 LAB — TSH: TSH: 1.855 u[IU]/mL (ref 0.350–4.500)

## 2021-12-10 MED ORDER — OXYBUTYNIN CHLORIDE ER 5 MG PO TB24
5.0000 mg | ORAL_TABLET | Freq: Every day | ORAL | Status: DC
  Administered 2021-12-10 – 2021-12-11 (×2): 5 mg via ORAL
  Filled 2021-12-10 (×2): qty 1

## 2021-12-10 MED ORDER — ATORVASTATIN CALCIUM 40 MG PO TABS
40.0000 mg | ORAL_TABLET | Freq: Every day | ORAL | Status: DC
  Administered 2021-12-11: 40 mg via ORAL
  Filled 2021-12-10: qty 1

## 2021-12-10 MED ORDER — VALACYCLOVIR HCL 500 MG PO TABS: 1000.0000 mg | ORAL_TABLET | Freq: Every day | ORAL | Status: DC | PRN

## 2021-12-10 MED ORDER — EZETIMIBE 10 MG PO TABS
10.0000 mg | ORAL_TABLET | Freq: Every day | ORAL | Status: DC
  Administered 2021-12-10 – 2021-12-11 (×2): 10 mg via ORAL
  Filled 2021-12-10 (×2): qty 1

## 2021-12-10 NOTE — Progress Notes (Signed)
?Progress Note ? ? ? ?Nathaniel Mcclain   ?ZOX:[id number]  ?DOB: 1935/12/27  ?DOA: 12/09/2021     1 ?PCP: Burnard Bunting, MD ? ?Initial CC: left hand weakness ? ?Hospital Course: ?Mr. Nathaniel Mcclain is an 86 yo male with PMH CVA (no residual deficits), DMII, GERD, HTN, HLD, hypothyroidism, osteoarthritis who presented with left hand/arm weakness.  He endorsed his symptoms had been ongoing for approximately 5 days prior to admission and after he began dropping items he was trying to hold with his left hand, he presented to his PCP office.  He underwent outpatient CT head which was unremarkable for acute stroke and due to ongoing symptoms, presented to the ER. ?MRI brain on admission showed multiple small acute infarctions involving the right posterior frontal vertex.  There were also small infarctions affecting the right parietal deep white matter.  Old CVA appreciated in the right posterior parietal/occipital junction.  CTA head/neck showed right ICA severe stenosis up to 80% and 50% stenosis involving the left carotid bifurcation.  Also moderate to advanced atheromatous irregularity involving right PCA with moderate to severe multifocal right P2 stenoses. ?He was admitted for further stroke work-up. ? ?Interval History:  ?Seen this morning in the ER.  He states that his left hand weakness has improved since coming to the hospital.  He worked well with PT/OT yesterday as well.  Plans for transferring to [facility] today for further neurology evaluation. ? ?Assessment and Plan: ?* Acute CVA (cerebrovascular accident) ([place]) ?- No residual deficits from history of prior stroke.  Presented with left hand and arm weakness. MRI brain shows acute CVA involving right sided territories; 80% stenosis noted in right ICA as well concerning for culprit (may need vascular surgery eval as well) ?- continue asa/plavix ?- follow up neurology evaluation on transfer to [facility] ?- follow up echo results ?- TSH normal (1.855), LDL 96 (change pravastatin to  lipitor), A1c 6.3% ?- no needs from PT/OT evals; follow up SLP eval for completeness with stroke workup  ? ?GERD (gastroesophageal reflux disease) ?- Continue Protonix ? ?Hyperlipidemia ?- CHL 201, TG 360, LDL 96, HDL 33 ?- On pravastatin 80 mg daily at home.  Changed to Lipitor 40 mg daily ? ?Essential hypertension ?- given length of symptoms prior to admission, no further permissive HTN needed ?- continue amlodipine, benazepril  ? ?Hypothyroidism ?- TSH normal, 1.855 ?- Continue Synthroid ? ? ?Old records reviewed in assessment of this patient ? ?Antimicrobials: ? ? ?DVT prophylaxis:  ?enoxaparin (LOVENOX) injection 40 mg Start: 12/09/21 2200 ? ? ?Code Status:   Code Status: Full Code ? ?Disposition Plan: Home possibly by Thursday ?Status is: Inpatient ? ?Objective: ?Blood pressure (!) 161/80, pulse 65, temperature 98.3 ?F (36.8 ?C), temperature source Oral, resp. rate 16, SpO2 96 %.  ?Examination:  ?Physical Exam ?Constitutional:   ?   General: He is not in acute distress. ?   Appearance: Normal appearance.  ?HENT:  ?   Head: Normocephalic and atraumatic.  ?   Mouth/Throat:  ?   Mouth: Mucous membranes are moist.  ?Eyes:  ?   Extraocular Movements: Extraocular movements intact.  ?Cardiovascular:  ?   Rate and Rhythm: Normal rate and regular rhythm.  ?   Heart sounds: Normal heart sounds.  ?Pulmonary:  ?   Effort: Pulmonary effort is normal. No respiratory distress.  ?   Breath sounds: Normal breath sounds. No wheezing.  ?Abdominal:  ?   General: Bowel sounds are normal. There is no distension.  ?  Palpations: Abdomen is soft.  ?   Tenderness: There is no abdominal tenderness.  ?Musculoskeletal:     ?   General: Normal range of motion.  ?   Cervical back: Normal range of motion and neck supple.  ?Skin: ?   General: Skin is warm and dry.  ?Neurological:  ?   General: No focal deficit present.  ?   Mental Status: He is alert.  ?   Sensory: No sensory deficit.  ?   Motor: No weakness.  ?   Coordination: Coordination  normal.  ?   Comments: Mild essential tremor in bilateral upper extremities appreciated  ?Psychiatric:     ?   Mood and Affect: Mood normal.     ?   Behavior: Behavior normal.  ?  ? ?Consultants:  ?Neurology ? ?Procedures:  ? ? ?Data Reviewed: ?Results for orders placed or performed during the hospital encounter of 12/09/21 (from the past 24 hour(s))  ?Urinalysis, Routine w reflex microscopic Urine, Clean Catch     Status: Abnormal  ? Collection Time: 12/09/21  5:05 PM  ?Result Value Ref Range  ? Color, Urine YELLOW YELLOW  ? APPearance CLEAR CLEAR  ? Specific Gravity, Urine 1.019 1.005 - 1.030  ? pH 7.0 5.0 - 8.0  ? Glucose, UA NEGATIVE NEGATIVE mg/dL  ? Hgb urine dipstick NEGATIVE NEGATIVE  ? Bilirubin Urine NEGATIVE NEGATIVE  ? Ketones, ur NEGATIVE NEGATIVE mg/dL  ? Protein, ur NEGATIVE NEGATIVE mg/dL  ? Nitrite NEGATIVE NEGATIVE  ? Leukocytes,Ua SMALL (A) NEGATIVE  ? RBC / HPF 0-5 0 - 5 RBC/hpf  ? WBC, UA 6-10 0 - 5 WBC/hpf  ? Bacteria, UA NONE SEEN NONE SEEN  ? Squamous Epithelial / LPF 0-5 0 - 5  ?Hemoglobin A1c     Status: Abnormal  ? Collection Time: 12/09/21 10:35 PM  ?Result Value Ref Range  ? Hgb A1c MFr Bld 6.3 (H) 4.8 - 5.6 %  ? Mean Plasma Glucose 134.11 mg/dL  ?CBC     Status: None  ? Collection Time: 12/09/21 10:35 PM  ?Result Value Ref Range  ? WBC 6.8 4.0 - 10.5 K/uL  ? RBC 4.43 4.22 - 5.81 MIL/uL  ? Hemoglobin 14.3 13.0 - 17.0 g/dL  ? HCT 41.2 39.0 - 52.0 %  ? MCV 93.0 80.0 - 100.0 fL  ? MCH 32.3 26.0 - 34.0 pg  ? MCHC 34.7 30.0 - 36.0 g/dL  ? RDW 13.9 11.5 - 15.5 %  ? Platelets 171 150 - 400 K/uL  ? nRBC 0.0 0.0 - 0.2 %  ?Creatinine, serum     Status: None  ? Collection Time: 12/09/21 10:35 PM  ?Result Value Ref Range  ? Creatinine, Ser 1.19 0.61 - 1.24 mg/dL  ? GFR, Estimated >60 >60 mL/min  ?Lipid panel     Status: Abnormal  ? Collection Time: 12/10/21  8:22 AM  ?Result Value Ref Range  ? Cholesterol 201 (H) 0 - 200 mg/dL  ? Triglycerides 360 (H) <150 mg/dL  ? HDL 33 (L) >40 mg/dL  ? Total  CHOL/HDL Ratio 6.1 RATIO  ? VLDL 72 (H) 0 - 40 mg/dL  ? LDL Cholesterol 96 0 - 99 mg/dL  ?TSH     Status: None  ? Collection Time: 12/10/21  8:22 AM  ?Result Value Ref Range  ? TSH 1.855 0.350 - 4.500 uIU/mL  ?  ?I have Reviewed nursing notes, Vitals, and Lab results since pt's last encounter. Pertinent lab results : see above ?I  have ordered test including BMP, CBC, Mg ?I have reviewed the last note from staff over past 24 hours ?I have discussed pt's care plan and test results with nursing staff, case manager ? ? LOS: 1 day  ? ?Dwyane Dee, MD ?[facility] ?12/10/2021, 3:18 PM ?

## 2021-12-10 NOTE — Consult Note (Signed)
Neurology Consultation ? ?Reason for Consult: Stroke on MRI  ?Referring Physician: Dr Candiss Norse ? ?CC: left hand weakness ? ?History is obtained from:patient and medical record  ? ?HPI: Nathaniel Mcclain is a 86 y.o. male with past medical history of HTN, HLD, hypothyroid, DM, TIA and GERD who presents to the ED after having outpatient MRI that revealed scattered strokes. Patient states he has had left had weakness and clumsiness for the last 5 days. He is unable to grab items and use his hand. He went to his PCP on Friday and had a [facility] outpatient which was negative for acute process. Symptoms continued and he had an MRI Brain as outpatient for follow up and revealed Few small acute infarctions at the right posterior frontal vertex and Few small acute infarctions also affecting the right parietal. He denies numbness, tingling, lower extremity weakness, slurred speech, facial droop, vision changes. He states his hand is getting better and feels that he can sort of use it more than he was able to. He went to [facility] and was transferred to [facility] for acute stroke workup ? ? ?LKW: Friday  ?tpa given?: no, outside window  ?Premorbid modified Rankin scale (mRS):  ?0-Completely asymptomatic and back to baseline post-stroke ? ?ROS: Full ROS was performed and is negative except as noted in the HPI.   ? ?Past Medical History:  ?Diagnosis Date  ? Arthritis   ? Colon polyps   ? Diabetes ([place])   ? GERD (gastroesophageal reflux disease)   ? Hyperlipidemia   ? Hypertension   ? Hypothyroid   ? Osteoarthritis   ? TIA (transient ischemic attack) 1980  ? ?Essential (primary) hypertension and Type 2 diabetes mellitus w/o complications  ? ?Family History  ?Problem Relation Age of Onset  ? Diabetes Mother   ? Heart disease Mother   ? Colon cancer Neg Hx   ? ? ? ?Social History:  ? reports that he has never smoked. He has never used smokeless tobacco. He reports that he does not drink alcohol and does not use drugs. ? ?Medications ? ?Current  Facility-Administered Medications:  ?   stroke: early stages of recovery book, , Does not apply, Once, Rai, Ripudeep K, MD ?  0.9 %  sodium chloride infusion, , Intravenous, Continuous, Rai, Ripudeep K, MD, Last Rate: 75 mL/hr at 12/10/21 1504, New Bag at 12/10/21 1504 ?  acetaminophen (TYLENOL) tablet 650 mg, 650 mg, Oral, Q4H PRN **OR** acetaminophen (TYLENOL) 160 MG/5ML solution 650 mg, 650 mg, Per Tube, Q4H PRN **OR** acetaminophen (TYLENOL) suppository 650 mg, 650 mg, Rectal, Q4H PRN, Rai, Ripudeep K, MD ?  amLODipine (NORVASC) tablet 10 mg, 10 mg, Oral, Daily, Rai, Ripudeep K, MD, 10 mg at 12/10/21 1149 ?  aspirin chewable tablet 81 mg, 81 mg, Oral, Daily, Rai, Ripudeep K, MD, 81 mg at 12/10/21 1150 ?  [START ON 12/11/2021] atorvastatin (LIPITOR) tablet 40 mg, 40 mg, Oral, Daily, Girguis, David, MD ?  benazepril (LOTENSIN) tablet 10 mg, 10 mg, Oral, BID, Rai, Ripudeep K, MD, 10 mg at 12/10/21 1150 ?  [START ON 12/11/2021] bisacodyl (DULCOLAX) EC tablet 10 mg, 10 mg, Oral, QODAY, Rai, Ripudeep K, MD ?  clopidogrel (PLAVIX) tablet 75 mg, 75 mg, Oral, Daily, Rai, Ripudeep K, MD, 75 mg at 12/10/21 1150 ?  enoxaparin (LOVENOX) injection 40 mg, 40 mg, Subcutaneous, Q24H, Rai, Ripudeep K, MD, 40 mg at 12/09/21 2227 ?  ezetimibe (ZETIA) tablet 10 mg, 10 mg, Oral, Daily, Thurnell Lose, MD ?  hydrALAZINE (APRESOLINE) injection 10 mg, 10 mg, Intravenous, Q6H PRN, Rai, Ripudeep K, MD ?  levothyroxine (SYNTHROID) tablet 150 mcg, 150 mcg, Oral, QAC breakfast, Rai, Ripudeep K, MD, 150 mcg at 12/10/21 0658 ?  oxybutynin (DITROPAN-XL) 24 hr tablet 5 mg, 5 mg, Oral, Daily, Candiss Norse, Margaree Mackintosh, MD ?  pantoprazole (PROTONIX) EC tablet 40 mg, 40 mg, Oral, Daily, Rai, Ripudeep K, MD, 40 mg at 12/10/21 1149 ?  valACYclovir (VALTREX) tablet 1,000 mg, 1,000 mg, Oral, Daily PRN, Thurnell Lose, MD ? ? ?Exam: ?Current vital signs: ?BP (!) 161/80 (BP Location: Right Arm)   Pulse 65   Temp 98.3 ?F (36.8 ?C) (Oral)   Resp 16   SpO2  96%  ?Vital signs in last 24 hours: ?Temp:  [97.6 ?F (36.4 ?C)-98.3 ?F (36.8 ?C)] 98.3 ?F (36.8 ?C) (05/10 1509) ?Pulse Rate:  [56-71] 65 (05/10 1509) ?Resp:  [14-19] 16 (05/10 1509) ?BP: (122-181)/(70-107) 161/80 (05/10 1509) ?SpO2:  [93 %-99 %] 96 % (05/10 1509) ? ?GENERAL: Awake, alert in NAD ?HEENT: - Normocephalic and atraumatic, dry mm ?LUNGS - Clear to auscultation bilaterally with no wheezes ?CV - S1S2 RRR, no m/r/g, equal pulses bilaterally. ?ABDOMEN - Soft, nontender, nondistended with normoactive BS ?Ext: warm, well perfused, intact peripheral pulses,  edema ? ?NEURO:  ?Mental Status: AA&Ox4 ?Language: speech is clear  Naming, repetition, fluency, and comprehension intact. ?Cranial Nerves: PERRL 2 mm/brisk. EOMI, visual fields full, no facial asymmetry, facial sensation intact, hearing intact, tongue/uvula/soft palate midline, normal sternocleidomastoid and trapezius muscle strength. No evidence of tongue atrophy or fibrillations ?Motor: 5/5 in all 4 extremities  ?Tone: is normal and bulk is normal ?Sensation- Intact to light touch bilaterally ?Coordination: FTN intact bilaterally, no ataxia in BLE. ?Gait- deferred ? ?NIHSS ?1a Level of Conscious.: 0 ?1b LOC Questions: 0 ?1c LOC Commands: 0 ?2 Best Gaze: 0 ?3 Visual: 0 ?4 Facial Palsy: 0 ?5a Motor Arm - left: 0 ?5b Motor Arm - Right: 0 ?6a Motor Leg - Left: 0 ?6b Motor Leg - Right: 0 ?7 Limb Ataxia: 0 ?8 Sensory: 0 ?9 Best Language: 0 ?10 Dysarthria: 0 ?11 Extinct. and Inatten.: 0 ?TOTAL: 0  ? ? ?Labs ?LDL-c 96 ?HGBA1c 6.3 ?TSH 1.855 ? ?Echo EF 60-65%  ? ?Imaging ?I have reviewed the images obtained: ? ?CT-head ?- Atrophy with small vessel chronic ischemic changes of deep cerebral white matter. ?- Old lacunar infarct LEFT basal ganglia extending into caudate. ?- No acute intracranial abnormalities ? ?CTA head/neck  ?1. Negative CTA for large vessel occlusion or other emergent finding. ?2. Extensive bulky calcified plaque about the right carotid  bulb/proximal right ICA with associated severe stenosis of up to 80% by NASCET criteria. ?3. 50% atheromatous stenosis about the left carotid bifurcation. ?4. Atheromatous plaque at the origins of both vertebral arteries with associated severe ostial stenoses. ?5. Moderate to advanced atheromatous irregularity involving the right PCA with associated moderate to severe multifocal right P2 stenoses. ? ?MRI examination of the brain ?Few small acute infarctions at the right posterior frontal vertex, probably affecting the pre central gyrus. No mass effect or hemorrhage. Minimal enhancement at those sites, often seen in acute to subacute infarctions. Few small acute infarctions also affecting the right parietal deep white matter in the forceps major region. No ?large infarction. ?  ?Extensive chronic small-vessel ischemic changes elsewhere throughout the brain as outlined above. Old infarction at the right posterior parietal/occipital junction. ? ?Assessment:  ?JAMARIEN RODKEY is a 86 y.o. male with past  medical history of HTN, HLD, hypothyroid, DM, TIA and GERD who presents to the ED after having outpatient MRI that revealed scattered strokes. Patient states he has had left had weakness and clumsiness for the last 5 days. He is unable to grab items and use his hand. He went to his PCP on Friday and had a [facility] outpatient which was negative for acute process. Symptoms continued and he had an MRI Brain as outpatient for follow up ? ? ?Recommendations: ?- Frequent neuro checks ?- Prophylactic therapy-Antiplatelet med: Aspirin - dose '81mg'$  and plavix '75mg'$  daily   ?- Risk factor modification ?- Telemetry monitoring ?- PT consult, OT consult, Speech consult ?- Stroke team to follow  ? ?Beulah Gandy DNP, ACNPC-AG  ? ? ? ?

## 2021-12-10 NOTE — Consult Note (Addendum)
?Hospital Consult ? ? ? ?Reason for Consult: CVA, carotid artery stenosis ?Requesting Physician: Dr. Candiss Norse ? ?MRN #:  [id number] ? ?History of Present Illness: This is a 86 y.o. male with past medical history significant for hypertension, hyperlipidemia, history of CVA, and diabetes mellitus with recent hemoglobin A1c of 6.  He presented to the emergency department with left hand weakness over the past several days.  Work-up included MRI brain demonstrating right-sided CVA.  He also underwent CTA head and neck demonstrating 80% stenosis of the right ICA.  Patient denies any further strokelike symptoms including slurring speech or changes in vision.  He also states his left hand weakness is nearly resolved.  He is on aspirin daily.  He was started on Plavix.  He also takes a statin daily.  He denies tobacco use. ? ?Past Medical History:  ?Diagnosis Date  ? Arthritis   ? Colon polyps   ? Diabetes ([place])   ? GERD (gastroesophageal reflux disease)   ? Hyperlipidemia   ? Hypertension   ? Hypothyroid   ? Osteoarthritis   ? TIA (transient ischemic attack) 1980  ? ? ?Past Surgical History:  ?Procedure Laterality Date  ? THYROIDECTOMY  2008  ? ? ?No Known Allergies ? ?Prior to Admission medications   ?Medication Sig Start Date End Date Taking? Authorizing Provider  ?amLODipine (NORVASC) 10 MG tablet Take 10 mg by mouth daily. 10/13/21  Yes [provider]  ?aspirin 325 MG EC tablet Take 325 mg by mouth daily.   Yes [provider]  ?benazepril (LOTENSIN) 20 MG tablet Take 10 mg by mouth 2 (two) times daily. 11/10/21  Yes [provider]  ?bisacodyl (DULCOLAX) 5 MG EC tablet Take 10 mg by mouth every other day.   Yes [provider]  ?esomeprazole (NEXIUM) 40 MG capsule Take 40 mg by mouth daily at 12 noon.   Yes [provider]  ?levothyroxine (SYNTHROID) 150 MCG tablet Take 150 mcg by mouth every morning. 12/08/21  Yes [provider]  ?oxybutynin (DITROPAN-XL) 5 MG 24 hr  tablet Take 5 mg by mouth daily. 12/08/21  Yes [provider]  ?pantoprazole (PROTONIX) 40 MG tablet Take 40 mg by mouth daily. 12/08/21  Yes [provider]  ?pravastatin (PRAVACHOL) 80 MG tablet Take 80 mg by mouth daily.   Yes [provider]  ?valACYclovir (VALTREX) 1000 MG tablet Take 1,000 mg by mouth daily as needed (For cold sores). 10/01/21  Yes [provider]  ?COVID-19 mRNA bivalent vaccine, Pfizer, (PFIZER COVID-19 VAC BIVALENT) injection Inject into the muscle. 05/06/21     ?COVID-19 mRNA Vac-TriS, Pfizer, (PFIZER-BIONT COVID-19 VAC-TRIS) SUSP injection Inject into the muscle. 01/15/21   Carlyle Basques, MD  ?influenza vaccine adjuvanted (FLUAD QUADRIVALENT) 0.5 ML injection Inject into the muscle. 05/06/21     ? ? ?Social History  ? ?Socioeconomic History  ? Marital status: Married  ?  Spouse name: Not on file  ? Number of children: Not on file  ? Years of education: Not on file  ? Highest education level: Not on file  ?Occupational History  ? Not on file  ?Tobacco Use  ? Smoking status: Never  ? Smokeless tobacco: Never  ?Vaping Use  ? Vaping Use: Never used  ?Substance and Sexual Activity  ? Alcohol use: No  ?  Alcohol/week: 0.0 standard drinks  ? Drug use: No  ? Sexual activity: Not on file  ?Other Topics Concern  ? Not on file  ?Social History Narrative  ?  Not on file  ? ?Social Determinants of Health  ? ?Financial Resource Strain: Not on file  ?Food Insecurity: Not on file  ?Transportation Needs: Not on file  ?Physical Activity: Not on file  ?Stress: Not on file  ?Social Connections: Not on file  ?Intimate Partner Violence: Not on file  ? ? ? ?Family History  ?Problem Relation Age of Onset  ? Diabetes Mother   ? Heart disease Mother   ? Colon cancer Neg Hx   ? ? ?ROS: Otherwise negative unless mentioned in HPI ? ?Physical Examination ? ?Vitals:  ? 12/10/21 1409 12/10/21 1509  ?BP: (!) 153/91 (!) 161/80  ?Pulse: 65 65  ?Resp: 19 16  ?Temp: 97.6 ?F (36.4 ?C) 98.3 ?F  (36.8 ?C)  ?SpO2: 96% 96%  ? ?There is no height or weight on file to calculate BMI. ? ?General:  WDWN in NAD ?Gait: Not observed ?HENT: WNL, normocephalic ?Pulmonary: normal non-labored breathing, without Rales, rhonchi,  wheezing ?Cardiac: regular ?Abdomen:  soft, NT/ND, no masses ?Skin: without rashes ?Vascular Exam/Pulses: Symmetrical radial pulses; symmetrical PT pulses ?Extremities: without ischemic changes, without Gangrene , without cellulitis; without open wounds;  ?Musculoskeletal: no muscle wasting or atrophy  ?Neurologic: A&O X 3; mild left hand grip strength deficit compared to right ?Psychiatric:  The pt has Normal affect. ?Lymph:  Unremarkable ? ?CBC ?   ?Component Value Date/Time  ? WBC 6.8 12/09/2021 2235  ? RBC 4.43 12/09/2021 2235  ? HGB 14.3 12/09/2021 2235  ? HCT 41.2 12/09/2021 2235  ? PLT 171 12/09/2021 2235  ? MCV 93.0 12/09/2021 2235  ? MCH 32.3 12/09/2021 2235  ? MCHC 34.7 12/09/2021 2235  ? RDW 13.9 12/09/2021 2235  ? LYMPHSABS 2.6 12/09/2021 1440  ? MONOABS 0.6 12/09/2021 1440  ? EOSABS 0.1 12/09/2021 1440  ? BASOSABS 0.1 12/09/2021 1440  ? ? ?BMET ?   ?Component Value Date/Time  ? NA 140 12/09/2021 1440  ? K 4.2 12/09/2021 1440  ? CL 104 12/09/2021 1440  ? CO2 27 12/09/2021 1440  ? GLUCOSE 99 12/09/2021 1440  ? BUN 18 12/09/2021 1440  ? CREATININE 1.19 12/09/2021 2235  ? CALCIUM 9.2 12/09/2021 1440  ? GFRNONAA >60 12/09/2021 2235  ? ? ?COAGS: ?Lab Results  ?Component Value Date  ? INR 1.0 12/09/2021  ? ? ? ?Non-Invasive Vascular Imaging:   ?MRI brain significant for right-sided CVA ? ?CTA neck demonstrating 80% stenosis of the right ICA and 50% stenosis of the left ICA ? ? ? ? ?ASSESSMENT/PLAN: This is a 86 y.o. male with symptomatic right carotid artery stenosis, CVA ? ?-Subjectively, patient believes his left hand weakness has nearly returned to baseline.  He denies any further strokelike symptoms. ?-Patient experienced a right-sided CVA on MRI.  CTA neck demonstrates 80% stenosis of  the right ICA which is the likely etiology. ?-Patient will be considered for right carotid artery endarterectomy versus TCAR within the next 2 weeks.  On-call vascular surgeon Dr. Virl Cagey will evaluate the patient later today and provide further treatment plans. ? ? ?Dagoberto Ligas PA-C ?[facility] ?[phone] ? ? ?VASCULAR STAFF ADDENDUM: ?I have independently interviewed and examined the patient. ?I agree with the above.  ?In brief, patient with symptomatic right-sided ICA stenosis.  Left-sided deficits-mainly weakness in the left upper extremity, have pretty much resolved ?Patient on dual antiplatelet therapy, high intensity statin ?I had a long conversation with Shanon Brow regarding cerebral revascularization in the setting of recent stroke.  Shanon Brow has roughly a 25%  chance of having another stroke in the next 2 years.  With his current health, I have offered transcarotid artery revascularization in an effort to decrease his stroke risk.  After discussing the risk and benefits of surgery, Shanon Brow elected to proceed. ?He is scheduled for next Thursday.  He can present as an outpatient. ? ?Please continue dual antiplatelet therapy, high intensity statin  ? ?J. Melene Muller, MD ?[facility] ?Office Phone Number: [phone] ?12/10/2021 8:42 PM ? ? ?

## 2021-12-10 NOTE — Plan of Care (Addendum)
Nathaniel Mcclain, is a 86 y.o. male, DOB - 23-Feb-1936, FAO:[id number]   Patient transferred from [place], seen by my partner today, R. 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 ICA bulky plaque.   On ASA, Zetia added to Max statin, Neuro and [facility] called, may need Plavix as well, TTE stable.    Vitals:   12/10/21 0800 12/10/21 1100 12/10/21 1409 12/10/21 1509  BP: 132/83 132/70 (!) 153/91 (!) 161/80  Pulse: 64 67 65 65  Resp: 18 14 19 16   Temp:   97.6 F (36.4 C) 98.3 F (36.8 C)  TempSrc:   Oral Oral  SpO2: 98% 95% 96% 96%        Data Review   Micro Results No results found for this or any previous visit (from the past 240 hour(s)).  Radiology Reports CT ANGIO HEAD W OR WO CONTRAST  Result Date: 12/09/2021 CLINICAL DATA:  Follow-up examination for acute stroke. EXAM: CT ANGIOGRAPHY HEAD AND NECK TECHNIQUE: Multidetector CT imaging of the head and neck was performed using the standard protocol during bolus administration of intravenous contrast. Multiplanar CT image reconstructions and MIPs were obtained to evaluate the vascular anatomy. Carotid stenosis measurements (when applicable) are obtained utilizing NASCET criteria, using the distal internal carotid diameter as the denominator. RADIATION DOSE REDUCTION: This exam was performed according to the departmental dose-optimization program which includes automated exposure control, adjustment of the mA and/or kV according to patient size and/or use of iterative reconstruction technique. CONTRAST:  75mL OMNIPAQUE IOHEXOL 350 MG/ML SOLN COMPARISON:  Prior MRI from earlier the same day. FINDINGS: CT HEAD FINDINGS Brain: Age-related cerebral atrophy with moderate chronic microvascular ischemic disease. Remote lacunar infarct present at the left basal ganglia. Small remote right PCA distribution infarct noted. Previously identified small ischemic infarcts involving the  right cerebral hemisphere grossly stable. No mass effect or associated hemorrhage. No other acute large vessel territory infarct or intracranial hemorrhage. No midline shift or hydrocephalus. No extra-axial fluid collection. No mass lesion. Vascular: No hyperdense vessel. Skull: Scalp soft tissues within normal limits.  Calvarium intact. Sinuses: Scattered mucosal thickening noted about the ethmoidal air cells. Paranasal sinuses are otherwise clear. No mastoid effusion. Orbits: Globes orbital soft tissues demonstrate no acute finding. Review of the MIP images confirms the above findings CTA NECK FINDINGS Aortic arch: Examination mildly degraded by timing of the contrast bolus and streak artifact from surgical clips and dental amalgam. Visualized aortic arch normal caliber with standard three-vessel branching pattern. Mild-to-moderate atheromatous change about the arch and origin the great vessels without hemodynamically significant stenosis. Right carotid system: Right CCA patent without stenosis. Extensive bulky calcified plaque about the right carotid bulb/proximal right ICA with associated severe stenosis of up to approximately 80% by NASCET criteria. Right ICA patent distally without stenosis or dissection. Left carotid system: Left CCA patent from its origin to the bifurcation. Eccentric bulky calcified plaque at the left carotid bulb/proximal left ICA with associated stenosis of up to approximately 50% by NASCET criteria. Left ICA patent distally without stenosis or dissection. Vertebral arteries: Both vertebral arteries arise from subclavian arteries. No proximal subclavian artery stenosis. Calcified plaque at the origins of both vertebral arteries with associated severe ostial stenoses, slightly worse on the left. Vertebral arteries otherwise patent distally without stenosis or dissection. Skeleton: No discrete or worrisome osseous lesions. Mild-to-moderate spondylosis present at C5-6 and C6-7. Prominent  osteoarthritic changes present about the C1-2 articulation. Other neck: No other acute soft tissue abnormality within the neck. Sequelae  of prior thyroidectomy. Upper chest: Visualized upper chest demonstrates no acute finding. Review of the MIP images confirms the above findings CTA HEAD FINDINGS Anterior circulation: Petrous segments patent bilaterally. Mild for age atheromatous change within the carotid siphons without significant stenosis. A1 segments, anterior communicating complex common anterior cerebral arteries patent without stenosis or other abnormality. No M1 stenosis or occlusion. No proximal MCA branch occlusion. Distal MCA branches well perfused and symmetric. Posterior circulation: Both V4 segments widely patent. Left vertebral artery dominant. Both PICA patent at their origins. Basilar patent without stenosis. Superior cerebellar arteries patent bilaterally. Both PCAs primarily supplied via the basilar atheromatous irregularity seen involving the right greater than left PCAs bilaterally. Associated multifocal moderate to severe stenoses seen involving the right P2 segment (series 12, image 56). No proximal high-grade stenosis about the contralateral left PCA. PCAs remain grossly patent to their distal aspects. Venous sinuses: Grossly patent allowing for timing the contrast bolus. Anatomic variants: None significant.  No aneurysm. Review of the MIP images confirms the above findings IMPRESSION: 1. Negative CTA for large vessel occlusion or other emergent finding. 2. Extensive bulky calcified plaque about the right carotid bulb/proximal right ICA with associated severe stenosis of up to 80% by NASCET criteria. 3. 50% atheromatous stenosis about the left carotid bifurcation. 4. Atheromatous plaque at the origins of both vertebral arteries with associated severe ostial stenoses. 5. Moderate to advanced atheromatous irregularity involving the right PCA with associated moderate to severe multifocal right  P2 stenoses. Electronically Signed   By: Rise Mu M.D.   On: 12/09/2021 22:17   CT HEAD WO CONTRAST ( )  Result Date: 12/05/2021 CLINICAL DATA:  LEFT side weakness, history diabetes mellitus, hypertension, TIA EXAM: CT HEAD WITHOUT CONTRAST TECHNIQUE: Contiguous axial images were obtained from the base of the skull through the vertex without intravenous contrast. RADIATION DOSE REDUCTION: This exam was performed according to the departmental dose-optimization program which includes automated exposure control, adjustment of the mA and/or kV according to patient size and/or use of iterative reconstruction technique. COMPARISON:  None FINDINGS: Brain: Generalized atrophy. Normal ventricular morphology. No midline shift or mass effect. Small vessel chronic ischemic changes of deep cerebral white matter. Small lacunar infarct LEFT basal ganglia extending into caudate, appears old. No intracranial hemorrhage, mass lesion, evidence of acute infarction, or extra-axial fluid collection. Vascular: No hyperdense vessels Skull: Intact Sinuses/Orbits: Minimal mucosal thickening in sphenoid sinus. Remaining paranasal sinuses and mastoid air cells clear Other: N/A IMPRESSION: Atrophy with small vessel chronic ischemic changes of deep cerebral white matter. Old lacunar infarct LEFT basal ganglia extending into caudate. No acute intracranial abnormalities. Electronically Signed   By: Ulyses Southward M.D.   On: 12/05/2021 16:58   CT ANGIO NECK W OR WO CONTRAST  Result Date: 12/09/2021 CLINICAL DATA:  Follow-up examination for acute stroke. EXAM: CT ANGIOGRAPHY HEAD AND NECK TECHNIQUE: Multidetector CT imaging of the head and neck was performed using the standard protocol during bolus administration of intravenous contrast. Multiplanar CT image reconstructions and MIPs were obtained to evaluate the vascular anatomy. Carotid stenosis measurements (when applicable) are obtained utilizing NASCET criteria, using the distal  internal carotid diameter as the denominator. RADIATION DOSE REDUCTION: This exam was performed according to the departmental dose-optimization program which includes automated exposure control, adjustment of the mA and/or kV according to patient size and/or use of iterative reconstruction technique. CONTRAST:  75mL OMNIPAQUE IOHEXOL 350 MG/ML SOLN COMPARISON:  Prior MRI from earlier the same day. FINDINGS: CT HEAD FINDINGS Brain: Age-related  cerebral atrophy with moderate chronic microvascular ischemic disease. Remote lacunar infarct present at the left basal ganglia. Small remote right PCA distribution infarct noted. Previously identified small ischemic infarcts involving the right cerebral hemisphere grossly stable. No mass effect or associated hemorrhage. No other acute large vessel territory infarct or intracranial hemorrhage. No midline shift or hydrocephalus. No extra-axial fluid collection. No mass lesion. Vascular: No hyperdense vessel. Skull: Scalp soft tissues within normal limits.  Calvarium intact. Sinuses: Scattered mucosal thickening noted about the ethmoidal air cells. Paranasal sinuses are otherwise clear. No mastoid effusion. Orbits: Globes orbital soft tissues demonstrate no acute finding. Review of the MIP images confirms the above findings CTA NECK FINDINGS Aortic arch: Examination mildly degraded by timing of the contrast bolus and streak artifact from surgical clips and dental amalgam. Visualized aortic arch normal caliber with standard three-vessel branching pattern. Mild-to-moderate atheromatous change about the arch and origin the great vessels without hemodynamically significant stenosis. Right carotid system: Right CCA patent without stenosis. Extensive bulky calcified plaque about the right carotid bulb/proximal right ICA with associated severe stenosis of up to approximately 80% by NASCET criteria. Right ICA patent distally without stenosis or dissection. Left carotid system: Left CCA  patent from its origin to the bifurcation. Eccentric bulky calcified plaque at the left carotid bulb/proximal left ICA with associated stenosis of up to approximately 50% by NASCET criteria. Left ICA patent distally without stenosis or dissection. Vertebral arteries: Both vertebral arteries arise from subclavian arteries. No proximal subclavian artery stenosis. Calcified plaque at the origins of both vertebral arteries with associated severe ostial stenoses, slightly worse on the left. Vertebral arteries otherwise patent distally without stenosis or dissection. Skeleton: No discrete or worrisome osseous lesions. Mild-to-moderate spondylosis present at C5-6 and C6-7. Prominent osteoarthritic changes present about the C1-2 articulation. Other neck: No other acute soft tissue abnormality within the neck. Sequelae of prior thyroidectomy. Upper chest: Visualized upper chest demonstrates no acute finding. Review of the MIP images confirms the above findings CTA HEAD FINDINGS Anterior circulation: Petrous segments patent bilaterally. Mild for age atheromatous change within the carotid siphons without significant stenosis. A1 segments, anterior communicating complex common anterior cerebral arteries patent without stenosis or other abnormality. No M1 stenosis or occlusion. No proximal MCA branch occlusion. Distal MCA branches well perfused and symmetric. Posterior circulation: Both V4 segments widely patent. Left vertebral artery dominant. Both PICA patent at their origins. Basilar patent without stenosis. Superior cerebellar arteries patent bilaterally. Both PCAs primarily supplied via the basilar atheromatous irregularity seen involving the right greater than left PCAs bilaterally. Associated multifocal moderate to severe stenoses seen involving the right P2 segment (series 12, image 56). No proximal high-grade stenosis about the contralateral left PCA. PCAs remain grossly patent to their distal aspects. Venous sinuses:  Grossly patent allowing for timing the contrast bolus. Anatomic variants: None significant.  No aneurysm. Review of the MIP images confirms the above findings IMPRESSION: 1. Negative CTA for large vessel occlusion or other emergent finding. 2. Extensive bulky calcified plaque about the right carotid bulb/proximal right ICA with associated severe stenosis of up to 80% by NASCET criteria. 3. 50% atheromatous stenosis about the left carotid bifurcation. 4. Atheromatous plaque at the origins of both vertebral arteries with associated severe ostial stenoses. 5. Moderate to advanced atheromatous irregularity involving the right PCA with associated moderate to severe multifocal right P2 stenoses. Electronically Signed   By: Rise Mu M.D.   On: 12/09/2021 22:17   MR BRAIN W WO CONTRAST  Result Date:  12/09/2021 CLINICAL DATA:  Bent over several days ago and struck forehead on table. Left hand weakness. EXAM: MRI HEAD WITHOUT AND WITH CONTRAST TECHNIQUE: Multiplanar, multiecho pulse sequences of the brain and surrounding structures were obtained without and with intravenous contrast. CONTRAST:  8mL GADAVIST GADOBUTROL 1 MMOL/ML IV SOLN COMPARISON:  Head CT 12/05/2021 FINDINGS: Brain: Diffusion imaging shows a cluster of small acute infarctions affecting the right posterior frontal cortex, possibly the precentral gyrus, and a few foci of the deep white matter in the forceps major region on the right. No large confluent infarction. There chronic small-vessel ischemic changes of the pons. No focal cerebellar insult. Cerebral hemispheres show old small vessel infarctions of the thalami, basal ganglia and throughout the cerebral hemispheric white matter. There is an old right posterior parietooccipital junction cortical infarction. No mass lesion, hemorrhage, hydrocephalus or extra-axial collection. After contrast administration, there is minimal enhancement at the site of the right posterior frontal infarctions.  Vascular: Major vessels at the base of the brain show flow. Skull and upper cervical spine: Negative Sinuses/Orbits: Clear/normal Other: None IMPRESSION: Few small acute infarctions at the right posterior frontal vertex, probably affecting the pre central gyrus. No mass effect or hemorrhage. Minimal enhancement at those sites, often seen in acute to subacute infarctions. Few small acute infarctions also affecting the right parietal deep white matter in the forceps major region. No large infarction. Extensive chronic small-vessel ischemic changes elsewhere throughout the brain as outlined above. Old infarction at the right posterior parietal/occipital junction. These results will be called to the ordering clinician or representative by the Radiologist Assistant, and communication documented in the PACS or [employer]. Electronically Signed   By: Paulina Fusi M.D.   On: 12/09/2021 13:50   ECHOCARDIOGRAM COMPLETE  Result Date: 12/10/2021    ECHOCARDIOGRAM REPORT   Patient Name:   Nathaniel Mcclain Date of Exam: 12/10/2021 Medical Rec #:  [id number]   Height:       70.0 in Accession #:    [id number]  Weight:       180.0 lb Date of Birth:  [birth year]   BSA:          1.996 m Patient Age:    86 years    BP:           131/70 mmHg Patient Gender: M           HR:           62 bpm. Exam Location:  Inpatient Procedure: 2D Echo, Color Doppler and Cardiac Doppler Indications:    Stroke i63.9  History:        Patient has no prior history of Echocardiogram examinations.                 Risk Factors:Hypertension, Diabetes and Dyslipidemia.  Sonographer:    Irving Burton Senior RDCS Referring Phys: [id number] RIPUDEEP K RAI IMPRESSIONS  1. Left ventricular ejection fraction, by estimation, is 60 to 65%. The left ventricle has normal function. The left ventricle has no regional wall motion abnormalities. There is mild concentric left ventricular hypertrophy. Left ventricular diastolic parameters are consistent with Grade I diastolic dysfunction  (impaired relaxation). Elevated left atrial pressure.  2. Right ventricular systolic function is normal. The right ventricular size is normal.  3. The mitral valve is normal in structure. Trivial mitral valve regurgitation. No evidence of mitral stenosis.  4. The aortic valve is tricuspid. There is moderate calcification of the aortic valve. There is moderate thickening of the aortic  valve. Aortic valve regurgitation is not visualized. Mild aortic valve stenosis. Aortic valve area, by VTI measures 1.43 cm. Aortic valve mean gradient measures 14.0 mmHg. Aortic valve Vmax measures 2.60 m/s.  5. Aortic dilatation noted. There is borderline dilatation of the ascending aorta, measuring 36 mm.  6. The inferior vena cava is dilated in size with >50% respiratory variability, suggesting right atrial pressure of 8 mmHg. Comparison(s): No prior Echocardiogram. Conclusion(s)/Recommendation(s): No intracardiac source of embolism detected on this transthoracic study. Consider a transesophageal echocardiogram to exclude cardiac source of embolism if clinically indicated. FINDINGS  Left Ventricle: Left ventricular ejection fraction, by estimation, is 60 to 65%. The left ventricle has normal function. The left ventricle has no regional wall motion abnormalities. The left ventricular internal cavity size was normal in size. There is  mild concentric left ventricular hypertrophy. Left ventricular diastolic parameters are consistent with Grade I diastolic dysfunction (impaired relaxation). Elevated left atrial pressure. Right Ventricle: The right ventricular size is normal. No increase in right ventricular wall thickness. Right ventricular systolic function is normal. Left Atrium: Left atrial size was normal in size. Right Atrium: Right atrial size was normal in size. Pericardium: There is no evidence of pericardial effusion. Mitral Valve: The mitral valve is normal in structure. Trivial mitral valve regurgitation. No evidence of  mitral valve stenosis. Tricuspid Valve: The tricuspid valve is normal in structure. Tricuspid valve regurgitation is trivial. Aortic Valve: The aortic valve is tricuspid. There is moderate calcification of the aortic valve. There is moderate thickening of the aortic valve. Aortic valve regurgitation is not visualized. Mild aortic stenosis is present. Aortic valve mean gradient measures 14.0 mmHg. Aortic valve peak gradient measures 27.0 mmHg. Aortic valve area, by VTI measures 1.43 cm. Pulmonic Valve: The pulmonic valve was grossly normal. Pulmonic valve regurgitation is not visualized. Aorta: The aortic root is normal in size and structure and aortic dilatation noted. There is borderline dilatation of the ascending aorta, measuring 36 mm. Venous: The inferior vena cava is dilated in size with greater than 50% respiratory variability, suggesting right atrial pressure of 8 mmHg. IAS/Shunts: The atrial septum is grossly normal.  LEFT VENTRICLE PLAX 2D LVIDd:         3.20 cm   Diastology LVIDs:         2.40 cm   LV e' medial:    6.31 cm/s LV PW:         1.10 cm   LV E/e' medial:  15.4 LV IVS:        1.20 cm   LV e' lateral:   3.59 cm/s LVOT diam:     1.90 cm   LV E/e' lateral: 27.1 LV SV:         87 LV SV Index:   44 LVOT Area:     2.84 cm  RIGHT VENTRICLE RV S prime:     12.00 cm/s TAPSE (M-mode): 2.3 cm LEFT ATRIUM             Index        RIGHT ATRIUM           Index LA diam:        2.70 cm 1.35 cm/m   RA Area:     16.00 cm LA Vol (A2C):   50.5 ml 25.30 ml/m  RA Volume:   45.40 ml  22.75 ml/m LA Vol (A4C):   37.1 ml 18.59 ml/m LA Biplane Vol: 43.8 ml 21.94 ml/m  AORTIC VALVE AV Area (Vmax):  1.38 cm AV Area (Vmean):   1.55 cm AV Area (VTI):     1.43 cm AV Vmax:           260.00 cm/s AV Vmean:          171.000 cm/s AV VTI:            0.609 m AV Peak Grad:      27.0 mmHg AV Mean Grad:      14.0 mmHg LVOT Vmax:         127.00 cm/s LVOT Vmean:        93.400 cm/s LVOT VTI:          0.308 m LVOT/AV VTI ratio:  0.51  AORTA Ao Root diam: 3.30 cm Ao Asc diam:  3.60 cm MITRAL VALVE MV Area (PHT): 2.45 cm     SHUNTS MV Decel Time: 310 msec     Systemic VTI:  0.31 m MV E velocity: 97.30 cm/s   Systemic Diam: 1.90 cm MV A velocity: 120.00 cm/s MV E/A ratio:  0.81 Laurance Flatten MD Electronically signed by Laurance Flatten MD Signature Date/Time: 12/10/2021/12:46:54 PM    Final     CBC Recent Labs  Lab 12/09/21 1440 12/09/21 2235  WBC 7.6 6.8  HGB 15.3 14.3  HCT 44.3 41.2  PLT 195 171  MCV 93.5 93.0  MCH 32.3 32.3  MCHC 34.5 34.7  RDW 14.1 13.9  LYMPHSABS 2.6  --   MONOABS 0.6  --   EOSABS 0.1  --   BASOSABS 0.1  --     Chemistries  Recent Labs  Lab 12/09/21 1440 12/09/21 2235  NA 140  --   K 4.2  --   CL 104  --   CO2 27  --   GLUCOSE 99  --   BUN 18  --   CREATININE 1.08 1.19  CALCIUM 9.2  --   AST 25  --   ALT 23  --   ALKPHOS 51  --   BILITOT 0.7  --    ------------------------------------------------------------------------------------------------------------------ CrCl cannot be calculated (Unknown ideal weight.). ------------------------------------------------------------------------------------------------------------------ Recent Labs    12/09/21 2235  HGBA1C 6.3*   ------------------------------------------------------------------------------------------------------------------ Recent Labs    12/10/21 0822  CHOL 201*  HDL 33*  LDLCALC 96  TRIG 161*  CHOLHDL 6.1   ------------------------------------------------------------------------------------------------------------------ Recent Labs    12/10/21 0822  TSH 1.855   ------------------------------------------------------------------------------------------------------------------ No results for input(s): VITAMINB12, FOLATE, FERRITIN, TIBC, IRON, RETICCTPCT in the last 72 hours.  Coagulation profile Recent Labs  Lab 12/09/21 1440  INR 1.0    No results for input(s): DDIMER in the last 72  hours.  Cardiac Enzymes No results for input(s): CKMB, TROPONINI, MYOGLOBIN in the last 168 hours.  Invalid input(s): CK ------------------------------------------------------------------------------------------------------------------ Invalid input(s): POCBNP   Signature  Susa Raring M.D on 12/10/2021 at 3:18 PM   -  To page go to [email or web address]

## 2021-12-10 NOTE — Assessment & Plan Note (Signed)
-   given length of symptoms prior to admission, no further permissive HTN needed ?- continue amlodipine, benazepril  ?

## 2021-12-10 NOTE — Assessment & Plan Note (Addendum)
-   No residual deficits from history of prior stroke.  Presented with left hand and arm weakness. MRI brain shows acute CVA involving right sided territories; 80% stenosis noted in right ICA as well concerning for culprit (may need vascular surgery eval as well) ?- continue asa/plavix ?- follow up neurology evaluation on transfer to [facility] ?- follow up echo results ?- TSH normal (1.855), LDL 96 (change pravastatin to lipitor), A1c 6.3% ?- no needs from PT/OT evals; follow up SLP eval for completeness with stroke workup  ?

## 2021-12-10 NOTE — Evaluation (Signed)
Occupational Therapy Evaluation ?Patient Details ?Name: Nathaniel Mcclain ?MRN: [id number] ?DOB: 03-10-1936 ?Today's Date: 12/10/2021 ? ? ?History of Present Illness Patient is an 86 year old male admitted with x5 day report of L hand weakness. MRI: Few small acute infarctions at the right posterior frontal vertex,   Few small acute infarctions also affecting the right parietal deep white matter in the forceps major region. 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 ?Clinical Impression ?  ?Patient lives at home with his spouse in a single level home and is independent at baseline. Patient presents with 3+/5 R triceps strength and slightly decreased fine motor coordination in L hand, otherwise upper body within functional limits. Patient demonstrates ability to perform lower body dressing task, ambulation and transfers without physical assistance. Educate patient in exercises he can perform at home for L hand strengthening and fine motor coordination. Otherwise patient reports he is at his normal. No further acute OT needs, will sign off.   ?   ? ?Recommendations for follow up therapy are one component of a multi-disciplinary discharge planning process, led by the attending physician.  Recommendations may be updated based on patient status, additional functional criteria and insurance authorization.  ? ?Follow Up Recommendations ? No OT follow up  ?  ?Assistance Recommended at Discharge None  ?   ?Functional Status Assessment ? Patient has not had a recent decline in their functional status  ?Equipment Recommendations ? None recommended by OT  ?  ?   ?Precautions / Restrictions Precautions ?Precautions: None ?Restrictions ?Weight Bearing Restrictions: No  ? ?  ? ?Mobility Bed Mobility ?Overal bed mobility: Modified Independent ?  ?  ?  ?  ?  ?  ?  ?  ? ? ? ?  ?Balance Overall balance assessment: Mild deficits observed, not formally tested ?  ?  ?  ?  ?  ?  ?  ?  ?  ?  ?  ?  ?  ?  ?  ?  ?  ?  ?    ? ?ADL either performed or assessed with clinical judgement  ? ?ADL Overall ADL's : At baseline ?  ?  ?  ?  ?  ?  ?  ?  ?  ?  ?  ?  ?  ?  ?  ?  ?  ?  ?  ?General ADL Comments: Patient is able to perform lower body dressing task, functional ambulation and transfers without physical assistance. Does take small steps however patient reports he is at his normal  ? ? ? ?Vision Baseline Vision/History: 1 Wears glasses ?   ?   ?   ?   ? ?Pertinent Vitals/Pain Pain Assessment ?Pain Assessment: No/denies pain  ? ? ? ?Hand Dominance Right ?  ?Extremity/Trunk Assessment Upper Extremity Assessment ?Upper Extremity Assessment: RUE deficits/detail;LUE deficits/detail ?RUE Deficits / Details: tricep strength 3+/5 otherwise AROM/MMT WFL ?RUE Sensation: WNL ?RUE Coordination: WNL ?LUE Deficits / Details: Grossly WNL, finger opposition performed more slowly ?LUE Sensation: WNL ?LUE Coordination: decreased fine motor ?  ?Lower Extremity Assessment ?Lower Extremity Assessment: Defer to PT evaluation ?  ?Cervical / Trunk Assessment ?Cervical / Trunk Assessment: Normal ?  ?Communication Communication ?Communication: No difficulties ?  ?Cognition Arousal/Alertness: Awake/alert ?Behavior During Therapy: [facility] for tasks assessed/performed ?Overall Cognitive Status: Within Functional Limits for tasks assessed ?  ?  ?  ?  ?  ?  ?  ?  ?  ?  ?  ?  ?  ?  ?  ?  ?  General Comments: Grossly intact, did not know exact date but oriented to month/year ?  ?  ?   ?   ?   ? ? ?Home Living Family/patient expects to be discharged to:: Private residence ?Living Arrangements: Spouse/significant other ?Available Help at Discharge: Family ?Type of Home: House ?Home Access: Stairs to enter ?Entrance Stairs-Number of Steps: 3 steps front, 4 in back ?  ?Home Layout: One level ?  ?  ?Bathroom Shower/Tub: Tub/shower unit ?  ?Bathroom Toilet: Handicapped height ?  ?  ?Home Equipment: Conservation officer, nature (2 wheels);Wheelchair - manual;Shower seat;BSC/3in1 ?  ?  ?  ? ?   ?Prior Functioning/Environment Prior Level of Function : Independent/Modified Independent ?  ?  ?  ?  ?  ?  ?  ?  ?  ? ?  ?  ?OT Problem List: Decreased coordination ?  ?   ?   ?OT Goals(Current goals can be found in the care plan section) Acute Rehab OT Goals ?Patient Stated Goal: Return home ?OT Goal Formulation: All assessment and education complete, DC therapy  ? ?AM-PAC OT "6 Clicks" Daily Activity     ?Outcome Measure Help from another person eating meals?: None ?Help from another person taking care of personal grooming?: None ?Help from another person toileting, which includes using toliet, bedpan, or urinal?: None ?Help from another person bathing (including washing, rinsing, drying)?: None ?Help from another person to put on and taking off regular upper body clothing?: None ?Help from another person to put on and taking off regular lower body clothing?: None ?6 Click Score: 24 ?  ?End of Session Nurse Communication: Mobility status ? ?Activity Tolerance: Patient tolerated treatment well ?Patient left: in bed;with call bell/phone within reach ? ?OT Visit Diagnosis: Other symptoms and signs involving the nervous system (R29.898)  ?              ?Time: 1448-1856 ?OT Time Calculation (min): 15 min ?Charges:  OT General Charges ?$OT Visit: 1 Visit ?OT Evaluation ?$OT Eval Low Complexity: 1 Low ? ?Delbert Phenix OT ?OT pager: [phone] ? ? ?Rosemary Holms ?12/10/2021, 8:40 AM ?

## 2021-12-10 NOTE — Hospital Course (Signed)
Nathaniel Mcclain is an 86 yo male with PMH CVA (no residual deficits), DMII, GERD, HTN, HLD, hypothyroidism, osteoarthritis who presented with left hand/arm weakness.  He endorsed his symptoms had been ongoing for approximately 5 days prior to admission and after he began dropping items he was trying to hold with his left hand, he presented to his PCP office.  He underwent outpatient CT head which was unremarkable for acute stroke and due to ongoing symptoms, presented to the ER. ?MRI brain on admission showed multiple small acute infarctions involving the right posterior frontal vertex.  There were also small infarctions affecting the right parietal deep white matter.  Old CVA appreciated in the right posterior parietal/occipital junction.  CTA head/neck showed right ICA severe stenosis up to 80% and 50% stenosis involving the left carotid bifurcation.  Also moderate to advanced atheromatous irregularity involving right PCA with moderate to severe multifocal right P2 stenoses. ?He was admitted for further stroke work-up. ?

## 2021-12-10 NOTE — Assessment & Plan Note (Signed)
-   CHL 201, TG 360, LDL 96, HDL 33 ?- On pravastatin 80 mg daily at home.  Changed to Lipitor 40 mg daily ?

## 2021-12-10 NOTE — Assessment & Plan Note (Addendum)
Continue Protonix °

## 2021-12-10 NOTE — Assessment & Plan Note (Signed)
-   TSH normal, 1.855 ?- Continue Synthroid ?

## 2021-12-10 NOTE — ED Notes (Signed)
Echo tech at bedside. Will administer 1100 am meds when she is through. ?

## 2021-12-10 NOTE — Evaluation (Signed)
Physical Therapy Evaluation-1x ?Patient Details ?Name: Nathaniel Mcclain ?MRN: [id number] ?DOB: Jul 23, 1936 ?Today's Date: 12/10/2021 ? ?History of Present Illness ? Patient is an 86 year old male admitted with x5 day report of L hand weakness. MRI: Few small acute infarctions at the right posterior frontal vertex,   Few small acute infarctions also affecting the right parietal deep white matter in the forceps major region. PMH: prior TIA/CVA on aspirin, HTN, hyperlipidemia, hypothyroid, GERD  ?Clinical Impression ? On eval in ED, pt was Mod Ind with mobility. He walked around the unit without difficulty. No dizziness. No LOB. He feels his symptoms have resolved. No acute PT needs. 1x eval. Will sign off.    ?   ? ?Recommendations for follow up therapy are one component of a multi-disciplinary discharge planning process, led by the attending physician.  Recommendations may be updated based on patient status, additional functional criteria and insurance authorization. ? ?Follow Up Recommendations No PT follow up ? ?  ?Assistance Recommended at Discharge None  ?Patient can return home with the following ?   ? ?  ?Equipment Recommendations None recommended by PT  ?Recommendations for Other Services ?    ?  ?Functional Status Assessment    ? ?  ?Precautions / Restrictions Precautions ?Precautions: None ?Restrictions ?Weight Bearing Restrictions: No  ? ?  ? ?Mobility ? Bed Mobility ?Overal bed mobility: Independent ?  ?  ?  ?  ?  ?  ?  ?  ? ?Transfers ?Overall transfer level: Independent ?  ?  ?  ?  ?  ?  ?  ?  ?  ?  ? ?Ambulation/Gait ?Ambulation/Gait assistance: Modified independent (Device/Increase time) ?Gait Distance (Feet): 100 Feet ?Assistive device: None ?Gait Pattern/deviations: Decreased stride length ?  ?  ?  ?General Gait Details: No dizziness. No LOB ? ?Stairs ?  ?  ?  ?  ?  ? ?Wheelchair Mobility ?  ? ?Modified Rankin (Stroke Patients Only) ?  ? ?  ? ?Balance Overall balance assessment: Needs assistance ?  ?  ?  ?  ?   ?Standing balance-Leahy Scale: Good ?Standing balance comment: Had pt perform backwards walking, 360 turn, pick up object from floor-no LOB ?  ?  ?  ?  ?  ?  ?  ?  ?  ?  ?  ?   ? ? ? ?Pertinent Vitals/Pain Pain Assessment ?Pain Assessment: No/denies pain  ? ? ?Home Living Family/patient expects to be discharged to:: Private residence ?Living Arrangements: Spouse/significant other ?Available Help at Discharge: Family ?Type of Home: House ?Home Access: Stairs to enter ?  ?Entrance Stairs-Number of Steps: 3 steps front, 4 in back ?  ?Home Layout: One level ?Home Equipment: Conservation officer, nature (2 wheels);Wheelchair - manual;Shower seat;BSC/3in1 ?   ?  ?Prior Function Prior Level of Function : Independent/Modified Independent ?  ?  ?  ?  ?  ?  ?  ?  ?  ? ? ?Hand Dominance  ? Dominant Hand: Right ? ?  ?Extremity/Trunk Assessment  ? Upper Extremity Assessment ?Upper Extremity Assessment: Defer to OT evaluation ?RUE Deficits / Details: tricep strength 3+/5 otherwise AROM/MMT WFL ?RUE Sensation: WNL ?RUE Coordination: WNL ?LUE Deficits / Details: Grossly WNL, finger opposition performed more slowly ?LUE Sensation: WNL ?LUE Coordination: decreased fine motor ?  ? ?Lower Extremity Assessment ?Lower Extremity Assessment: Overall WFL for tasks assessed ?  ? ?Cervical / Trunk Assessment ?Cervical / Trunk Assessment: Normal  ?Communication  ? Communication: No difficulties  ?  Cognition Arousal/Alertness: Awake/alert ?Behavior During Therapy: [facility] for tasks assessed/performed ?Overall Cognitive Status: Within Functional Limits for tasks assessed ?  ?  ?  ?  ?  ?  ?  ?  ?  ?  ?  ?  ?  ?  ?  ?  ?  ?  ?  ? ?  ?General Comments   ? ?  ?Exercises    ? ?Assessment/Plan  ?  ?PT Assessment Patient does not need any further PT services  ?PT Problem List   ? ?   ?  ?PT Treatment Interventions     ? ?PT Goals (Current goals can be found in the Care Plan section)  ?Acute Rehab PT Goals ?Patient Stated Goal: home soon ?PT Goal Formulation: All  assessment and education complete, DC therapy ? ?  ?Frequency   ?  ? ? ?Co-evaluation   ?  ?  ?  ?  ? ? ?  ?AM-PAC PT "6 Clicks" Mobility  ?Outcome Measure Help needed turning from your back to your side while in a flat bed without using bedrails?: None ?Help needed moving from lying on your back to sitting on the side of a flat bed without using bedrails?: None ?Help needed moving to and from a bed to a chair (including a wheelchair)?: None ?Help needed standing up from a chair using your arms (e.g., wheelchair or bedside chair)?: None ?Help needed to walk in hospital room?: None ?Help needed climbing 3-5 steps with a railing? : None ?6 Click Score: 24 ? ?  ?End of Session   ?Activity Tolerance: Patient tolerated treatment well ?Patient left: in bed;with call bell/phone within reach ?  ?  ?  ? ?Time: 2751-7001 ?PT Time Calculation (min) (ACUTE ONLY): 17 min ? ? ?Charges:   PT Evaluation ?$PT Eval Low Complexity: 1 Low ?  ?  ?   ? ? ? ? ? ?Saranya Harlin P, PT ?Acute Rehabilitation  ?Office: [phone] ?Pager: [phone] ? ?  ?

## 2021-12-11 ENCOUNTER — Other Ambulatory Visit: Payer: Self-pay

## 2021-12-11 ENCOUNTER — Other Ambulatory Visit (HOSPITAL_COMMUNITY): Payer: Self-pay

## 2021-12-11 DIAGNOSIS — I639 Cerebral infarction, unspecified: Secondary | ICD-10-CM | POA: Diagnosis not present

## 2021-12-11 DIAGNOSIS — I6521 Occlusion and stenosis of right carotid artery: Secondary | ICD-10-CM

## 2021-12-11 LAB — CBC WITH DIFFERENTIAL/PLATELET
Abs Immature Granulocytes: 0.02 10*3/uL (ref 0.00–0.07)
Basophils Absolute: 0.1 10*3/uL (ref 0.0–0.1)
Basophils Relative: 1 %
Eosinophils Absolute: 0.2 10*3/uL (ref 0.0–0.5)
Eosinophils Relative: 3 %
HCT: 41.6 % (ref 39.0–52.0)
Hemoglobin: 14.6 g/dL (ref 13.0–17.0)
Immature Granulocytes: 0 %
Lymphocytes Relative: 33 %
Lymphs Abs: 2.2 10*3/uL (ref 0.7–4.0)
MCH: 32.2 pg (ref 26.0–34.0)
MCHC: 35.1 g/dL (ref 30.0–36.0)
MCV: 91.6 fL (ref 80.0–100.0)
Monocytes Absolute: 0.5 10*3/uL (ref 0.1–1.0)
Monocytes Relative: 7 %
Neutro Abs: 3.7 10*3/uL (ref 1.7–7.7)
Neutrophils Relative %: 56 %
Platelets: 172 10*3/uL (ref 150–400)
RBC: 4.54 MIL/uL (ref 4.22–5.81)
RDW: 13.7 % (ref 11.5–15.5)
WBC: 6.6 10*3/uL (ref 4.0–10.5)
nRBC: 0 % (ref 0.0–0.2)

## 2021-12-11 LAB — COMPREHENSIVE METABOLIC PANEL
ALT: 18 U/L (ref 0–44)
AST: 21 U/L (ref 15–41)
Albumin: 3.6 g/dL (ref 3.5–5.0)
Alkaline Phosphatase: 42 U/L (ref 38–126)
Anion gap: 6 (ref 5–15)
BUN: 12 mg/dL (ref 8–23)
CO2: 24 mmol/L (ref 22–32)
Calcium: 8.8 mg/dL — ABNORMAL LOW (ref 8.9–10.3)
Chloride: 109 mmol/L (ref 98–111)
Creatinine, Ser: 0.87 mg/dL (ref 0.61–1.24)
GFR, Estimated: >60 mL/min (ref >60)
Glucose, Bld: 129 mg/dL — ABNORMAL HIGH (ref 70–99)
Potassium: 3.7 mmol/L (ref 3.5–5.1)
Sodium: 139 mmol/L (ref 135–145)
Total Bilirubin: 1 mg/dL (ref 0.3–1.2)
Total Protein: 6.2 g/dL — ABNORMAL LOW (ref 6.5–8.1)

## 2021-12-11 LAB — MAGNESIUM: Magnesium: 2.3 mg/dL (ref 1.7–2.4)

## 2021-12-11 MED ORDER — BENAZEPRIL HCL 20 MG PO TABS: 10.0000 mg | ORAL_TABLET | Freq: Two times a day (BID) | ORAL | Status: AC

## 2021-12-11 MED ORDER — ASPIRIN 81 MG PO CHEW
81.0000 mg | CHEWABLE_TABLET | Freq: Every day | ORAL | 0 refills | Status: AC
  Filled 2021-12-11: qty 30, 30d supply, fill #0

## 2021-12-11 MED ORDER — CLOPIDOGREL BISULFATE 75 MG PO TABS
75.0000 mg | ORAL_TABLET | Freq: Every day | ORAL | 0 refills | Status: AC
  Filled 2021-12-11: qty 30, 30d supply, fill #0

## 2021-12-11 MED ORDER — EZETIMIBE 10 MG PO TABS
10.0000 mg | ORAL_TABLET | Freq: Every day | ORAL | 0 refills | Status: AC
  Filled 2021-12-11: qty 30, 30d supply, fill #0

## 2021-12-11 MED ORDER — ROSUVASTATIN CALCIUM 40 MG PO TABS
40.0000 mg | ORAL_TABLET | Freq: Every day | ORAL | 0 refills | Status: AC
  Filled 2021-12-11: qty 30, 30d supply, fill #0

## 2021-12-11 NOTE — TOC Transition Note (Addendum)
Transition of Care (TOC) - CM/SW Discharge Note ? ? ?Patient Details  ?Name: Nathaniel Mcclain ?MRN: [id number] ?Date of Birth: [birth year] ? ?Transition of Care (TOC) CM/SW Contact:  ?Pollie Friar, RN ?Phone Number: ?12/11/2021, 10:12 AM ? ? ?Clinical Narrative:    ?Patient is discharging home with self care. No f/u per PT/OT.  ?Pt has transport home.  ?Pt to return next week for vascular procedure.  ?Medications for home to be delivered to the room per [place]. ? ? ?Final next level of care: Home/Self Care ?Barriers to Discharge: No Barriers Identified ? ? ?Patient Goals and CMS Choice ?  ?  ?  ? ?Discharge Placement ?  ?           ?  ?  ?  ?  ? ?Discharge Plan and Services ?  ?  ?           ?  ?  ?  ?  ?  ?  ?  ?  ?  ?  ? ?Social Determinants of Health (SDOH) Interventions ?  ? ? ?Readmission Risk Interventions ?   ? View : No data to display.  ?  ?  ?  ? ? ? ? ? ?

## 2021-12-11 NOTE — Discharge Summary (Signed)
?                                                                                ? Nathaniel Mcclain:[id number] DOB: 11/13/1935 DOA: 12/09/2021 ? ?PCP: Burnard Bunting, MD ? ?Admit date: 12/09/2021  Discharge date: 12/11/2021 ? ?Admitted From: Home   Disposition:  Home ? ? ?Recommendations for Outpatient Follow-up:  ? ?Follow up with PCP in 1-2 weeks ? ?PCP Please obtain BMP/CBC, 2 view CXR in 1week,  (see Discharge instructions)  ? ?PCP Please follow up on the following pending results:  ? ? ?Home Health: None   ?Equipment/Devices: None  ?Consultations: Neuro, VVS ?Discharge Condition: Stable    ?CODE STATUS: Full    ?Diet Recommendation: Heart Healthy  ? ?Diet Order   ? ?       ?  Diet - low sodium heart healthy       ?  ?  Diet Heart Room service appropriate? Yes; Fluid consistency: Thin  Diet effective now       ?  ? ?  ?  ? ?  ?  ? ?Chief Complaint  ?Patient presents with  ? Weakness  ?  ? ?Brief history of present illness from the day of admission and additional interim summary   ? ?86 yo male with PMH CVA (no residual deficits), DMII, GERD, HTN, HLD, hypothyroidism, osteoarthritis who presented with left hand/arm weakness, diagnosed with stroke.  Of note he has been having symptoms for almost 5 days before he presented to the hospital. ? ?                                                               Hospital Course  ? ?Acute CVA (cerebrovascular accident) ([place]) ?-  Presented with left hand and arm weakness. MRI brain shows acute CVA involving right sided territories; 80% stenosis noted in right ICA as well concerning for culprit (may need vascular surgery eval as well) ?- continue asa/plavix, LDL was above goal hence now placed on combination of Crestor and Zetia, echocardiogram stable, A1c is 6.3 and he received preliminary diabetic education.  Normal TSH.  Deficits have all resolved.  Seen by neurology and cleared for home  discharge on dual antiplatelet therapy and Crestor.  Note due to his right carotid artery stenosis he was seen by vascular surgery who are contemplating outpatient procedure in the next couple of weeks.  They will decide full duration of dual antiplatelet therapy.  He will also follow-up with neurologist in 2 weeks post discharge.  Currently symptoms have resolved he is at baseline and will be discharged home. ?  ?GERD (gastroesophageal reflux disease) ?- Continue Protonix ?  ?Hyperlipidemia ?- CHL 201, TG 360, LDL 96, HDL 33 ?- Patient on combination of Crestor and Zetia PCP to monitor. ?  ?Essential hypertension ?- given length of symptoms prior to admission, no further permissive HTN needed ?- continue amlodipine, ACE inhibitor from tomorrow ?  ?Hypothyroidism ?- TSH  normal, 1.855 ?- Continue Synthroid ? ?Significant right-sided carotid artery stenosis.  Seen by vascular surgeon.  Dual antiplatelet therapy and statin for now outpatient surgery. ? ?Discharge diagnosis   ? ? ?Principal Problem: ?  Acute CVA (cerebrovascular accident) ([place]) ?Active Problems: ?  Hypothyroidism ?  Essential hypertension ?  Hyperlipidemia ?  GERD (gastroesophageal reflux disease) ? ? ? ?Discharge instructions   ? ?Discharge Instructions   ? ? Ambulatory referral to Neurology   Complete by: As directed ?  ? An appointment is requested in approximately: 6 wks  ? Diet - low sodium heart healthy   Complete by: As directed ?  ? Discharge instructions   Complete by: As directed ?  ? Follow with Primary MD Burnard Bunting, MD in 7 days  ? ?Get CBC, CMP, 2 view Chest X ray -  checked next visit within 1 week by Primary MD   ? ?Activity: As tolerated with Full fall precautions use walker/cane & assistance as needed ? ?Disposition Home  ? ?Diet: Heart Healthy   ? ?Special Instructions: If you have smoked or chewed Tobacco  in the last 2 yrs please stop smoking, stop any regular Alcohol  and or any Recreational drug use. ? ?On your next visit  with your primary care physician please Get Medicines reviewed and adjusted. ? ?Please request your Prim.MD to go over all Hospital Tests and Procedure/Radiological results at the follow up, please get all Hospital records sent to your Prim MD by signing hospital release before you go home. ? ?If you experience worsening of your admission symptoms, develop shortness of breath, life threatening emergency, suicidal or homicidal thoughts you must seek medical attention immediately by calling 911 or calling your MD immediately  if symptoms less severe. ? ?You Must read complete instructions/literature along with all the possible adverse reactions/side effects for all the Medicines you take and that have been prescribed to you. Take any new Medicines after you have completely understood and accpet all the possible adverse reactions/side effects.  ? Increase activity slowly   Complete by: As directed ?  ? ?  ? ? ?Discharge Medications  ? ?Allergies as of 12/11/2021   ?No Known Allergies ?  ? ?  ?Medication List  ?  ? ?STOP taking these medications   ? ?aspirin 325 MG EC tablet ?Replaced by: aspirin 81 MG chewable tablet ?  ?pravastatin 80 MG tablet ?Commonly known as: PRAVACHOL ?  ? ?  ? ?TAKE these medications   ? ?amLODipine 10 MG tablet ?Commonly known as: NORVASC ?Take 10 mg by mouth daily. ?  ?aspirin 81 MG chewable tablet ?Chew 1 tablet (81 mg total) by mouth daily. ?Replaces: aspirin 325 MG EC tablet ?  ?benazepril 20 MG tablet ?Commonly known as: LOTENSIN ?Take 0.5 tablets (10 mg total) by mouth 2 (two) times daily. ?Start taking on: Dec 12, 2021 ?  ?bisacodyl 5 MG EC tablet ?Commonly known as: DULCOLAX ?Take 10 mg by mouth every other day. ?  ?clopidogrel 75 MG tablet ?Commonly known as: PLAVIX ?Take 1 tablet (75 mg total) by mouth daily. ?  ?esomeprazole 40 MG capsule ?Commonly known as: [place] ?Take 40 mg by mouth daily at 12 noon. ?  ?ezetimibe 10 MG tablet ?Commonly known as: Zetia ?Take 1 tablet (10 mg total)  by mouth daily. ?  ?Fluad Quadrivalent 0.5 ML injection ?Generic drug: influenza vaccine adjuvanted ?Inject into the muscle. ?  ?levothyroxine 150 MCG tablet ?Commonly known as: SYNTHROID ?Take 150 mcg by  mouth every morning. ?  ?oxybutynin 5 MG 24 hr tablet ?Commonly known as: DITROPAN-XL ?Take 5 mg by mouth daily. ?  ?pantoprazole 40 MG tablet ?Commonly known as: PROTONIX ?Take 40 mg by mouth daily. ?  ?Pfizer COVID-19 Vac Bivalent injection ?Generic drug: COVID-19 mRNA bivalent vaccine [employer]) ?Inject into the muscle. ?  ?Pfizer-BioNT COVID-19 Vac-TriS Susp injection ?Generic drug: COVID-19 mRNA Vac-TriS [employer]) ?Inject into the muscle. ?  ?rosuvastatin 40 MG tablet ?Commonly known as: Crestor ?Take 1 tablet (40 mg total) by mouth daily. ?  ?valACYclovir 1000 MG tablet ?Commonly known as: VALTREX ?Take 1,000 mg by mouth daily as needed (For cold sores). ?  ? ?  ? ? ? Follow-up Information   ? ? Burnard Bunting, MD. Schedule an appointment as soon as possible for a visit in 1 week(s).   ?Specialty: Internal Medicine ?Contact information: ?[address] ?[place] [address] ?[phone] ? ? ?  ?  ? ? [facility]. Schedule an appointment as soon as possible for a visit in 2 week(s).   ?Why: CVA ?Contact information: ?[address]     [place] [address] ?[phone] ? ?  ?  ? ? Broadus John, MD. Schedule an appointment as soon as possible for a visit in 1 week(s).   ?Specialty: Vascular Surgery ?Why: Carotid artery disease ?Contact information: ?[address] ?[place] [address] ?[phone] ? ? ?  ?  ? ?  ?  ? ?  ? ? ?Major procedures and Radiology Reports - PLEASE review detailed and final reports thoroughly  -    ? ?  ?CT ANGIO HEAD W OR WO CONTRAST ? ?Result Date: 12/09/2021 ?CLINICAL DATA:  Follow-up examination for acute stroke. EXAM: CT ANGIOGRAPHY HEAD AND NECK TECHNIQUE: Multidetector CT imaging of the head and neck was performed using the  standard protocol during bolus administration of intravenous contrast. Multiplanar CT image reconstructions and MIPs were obtained to evaluate the vascular anatomy. Carotid stenosis measurements (when app

## 2021-12-11 NOTE — Plan of Care (Signed)
?  Problem: Education: ?Goal: Knowledge of disease or condition will improve ?Outcome: Progressing ?Goal: Knowledge of secondary prevention will improve (SELECT ALL) ?Outcome: Progressing ?  ?Problem: Coping: ?Goal: Will verbalize positive feelings about self ?Outcome: Progressing ?  ?Problem: Self-Care: ?Goal: Ability to participate in self-care as condition permits will improve ?Outcome: Progressing ?Goal: Verbalization of feelings and concerns over difficulty with self-care will improve ?Outcome: Progressing ?  ?Problem: Ischemic Stroke/TIA Tissue Perfusion: ?Goal: Complications of ischemic stroke/TIA will be minimized ?Outcome: Progressing ?  ?

## 2021-12-11 NOTE — Progress Notes (Signed)
Pt discharged at this time.  He and his son verbalize understanding of all discharge instructions.  He was also given a letter from Dr. Unk Lightning office detailing instructions for surgery scheduled 5/18.  He has all belongings with him including cell phone, glasses, shaver and charger. ?

## 2021-12-11 NOTE — Discharge Instructions (Signed)
Follow with Primary MD Burnard Bunting, MD in 7 days  ? ?Get CBC, CMP, 2 view Chest X ray -  checked next visit within 1 week by Primary MD   ? ?Activity: As tolerated with Full fall precautions use walker/cane & assistance as needed ? ?Disposition Home  ? ?Diet: Heart Healthy   ? ?Special Instructions: If you have smoked or chewed Tobacco  in the last 2 yrs please stop smoking, stop any regular Alcohol  and or any Recreational drug use. ? ?On your next visit with your primary care physician please Get Medicines reviewed and adjusted. ? ?Please request your Prim.MD to go over all Hospital Tests and Procedure/Radiological results at the follow up, please get all Hospital records sent to your Prim MD by signing hospital release before you go home. ? ?If you experience worsening of your admission symptoms, develop shortness of breath, life threatening emergency, suicidal or homicidal thoughts you must seek medical attention immediately by calling 911 or calling your MD immediately  if symptoms less severe. ? ?You Must read complete instructions/literature along with all the possible adverse reactions/side effects for all the Medicines you take and that have been prescribed to you. Take any new Medicines after you have completely understood and accpet all the possible adverse reactions/side effects.  ? ?  ?

## 2021-12-16 NOTE — Pre-Procedure Instructions (Signed)
Surgical Instructions ? ? ? Your procedure is scheduled on Thursday, May 18th. ? Report to [facility] Main Entrance "A" at 5:30 A.M., then check in with the Admitting office. ? Call this number if you have problems the morning of surgery: ? [phone] ? ? If you have any questions prior to your surgery date call [phone]: Open Monday-Friday 8am-4pm ? ? ? Remember: ? Do not eat or drink after midnight the night before your surgery ? ? Take these medicines the morning of surgery with A SIP OF WATER:  ?amLODipine (NORVASC)  ?Aspirin ?clopidogrel (PLAVIX)  ?esomeprazole ([place]day) ?ezetimibe (ZETIA)  ?levothyroxine (SYNTHROID) ?oxybutynin (DITROPAN-XL)  ?pantoprazole (PROTONIX)  ?rosuvastatin (CRESTOR) ?valACYclovir (VALTREX)-if needed ? ?As of today, STOP taking any Aleve, Naproxen, Ibuprofen, Motrin, Advil, Goody's, BC's, all herbal medications, fish oil, and all vitamins. ? ?         ?Do not wear jewelry ?Do not wear lotions, powders, colognes, or deodorant. ?Men may shave face and neck. ?Do not bring valuables to the hospital. ? ?[place] is not responsible for any belongings or valuables. .  ? ?Do NOT Smoke (Tobacco/Vaping)  24 hours prior to your procedure ? ?If you use a CPAP at night, you may bring your mask for your overnight stay. ?  ?Contacts, glasses, hearing aids, dentures or partials may not be worn into surgery, please bring cases for these belongings ?  ?For patients admitted to the hospital, discharge time will be determined by your treatment team. ?  ?Patients discharged the day of surgery will not be allowed to drive home, and someone needs to stay with them for 24 hours. ? ? ?SURGICAL WAITING ROOM VISITATION ?Patients having surgery or a procedure in a hospital may have two support people. ?Children under the age of 27 must have an adult with them who is not the patient. ?They may stay in the waiting area during the procedure and may switch out with other visitors. If the patient needs to stay  at the hospital during part of their recovery, the visitor guidelines for inpatient rooms apply. ? ?Please refer to the [facility] website for the visitor guidelines for Inpatients (after your surgery is over and you are in a regular room).  ? ? ? ? ? ?Special instructions:   ? ?Oral Hygiene is also important to reduce your risk of infection.  Remember - BRUSH YOUR TEETH THE MORNING OF SURGERY WITH YOUR REGULAR TOOTHPASTE ? ? ?[place]- Preparing For Surgery ? ?Before surgery, you can play an important role. Because skin is not sterile, your skin needs to be as free of germs as possible. You can reduce the number of germs on your skin by washing with CHG (chlorahexidine gluconate) Soap before surgery.  CHG is an antiseptic cleaner which kills germs and bonds with the skin to continue killing germs even after washing.   ? ? ?Please do not use if you have an allergy to CHG or antibacterial soaps. If your skin becomes reddened/irritated stop using the CHG.  ?Do not shave (including legs and underarms) for at least 48 hours prior to first CHG shower. It is OK to shave your face. ? ?Please follow these instructions carefully. ?  ? ? Shower the NIGHT BEFORE SURGERY and the MORNING OF SURGERY with CHG Soap.  ? If you chose to wash your hair, wash your hair first as usual with your normal shampoo. After you shampoo, rinse your hair and body thoroughly to remove the shampoo.  Then [employer]  Face and genitals (private parts) with your normal soap and rinse thoroughly to remove soap. ? ?After that Use CHG Soap as you would any other liquid soap. You can apply CHG directly to the skin and wash gently with a scrungie or a clean washcloth.  ? ?Apply the CHG Soap to your body ONLY FROM THE NECK DOWN.  Do not use on open wounds or open sores. Avoid contact with your eyes, ears, mouth and genitals (private parts). Wash Face and genitals (private parts)  with your normal soap.  ? ?Wash thoroughly, paying special attention to the area  where your surgery will be performed. ? ?Thoroughly rinse your body with warm water from the neck down. ? ?DO NOT shower/wash with your normal soap after using and rinsing off the CHG Soap. ? ?Pat yourself dry with a CLEAN TOWEL. ? ?Wear CLEAN PAJAMAS to bed the night before surgery ? ?Place CLEAN SHEETS on your bed the night before your surgery ? ?DO NOT SLEEP WITH PETS. ? ? ?Day of Surgery: ? ?Take a shower with CHG soap. ?Wear Clean/Comfortable clothing the morning of surgery ?Do not apply any deodorants/lotions.   ?Remember to brush your teeth WITH YOUR REGULAR TOOTHPASTE. ? ? ? ?If you received a COVID test during your pre-op visit, it is requested that you wear a mask when out in public, stay away from anyone that may not be feeling well, and notify your surgeon if you develop symptoms. If you have been in contact with anyone that has tested positive in the last 10 days, please notify your surgeon. ? ?  ?Please read over the following fact sheets that you were given.  ? ?

## 2021-12-17 ENCOUNTER — Encounter (HOSPITAL_COMMUNITY)
Admission: RE | Admit: 2021-12-17 | Discharge: 2021-12-17 | Disposition: A | Discharge status: Home / Self Care | Payer: Medicare Other | Source: Ambulatory Visit | Attending: Vascular Surgery | Admitting: Vascular Surgery

## 2021-12-17 ENCOUNTER — Other Ambulatory Visit: Payer: Self-pay

## 2021-12-17 ENCOUNTER — Encounter (HOSPITAL_COMMUNITY): Payer: Self-pay

## 2021-12-17 VITALS — BP 145/79 | HR 70 | Temp 98.0°F | Resp 17 | Ht 72.0 in | Wt 180.0 lb

## 2021-12-17 DIAGNOSIS — E890 Postprocedural hypothyroidism: Secondary | ICD-10-CM | POA: Diagnosis not present

## 2021-12-17 DIAGNOSIS — I6782 Cerebral ischemia: Secondary | ICD-10-CM | POA: Insufficient documentation

## 2021-12-17 DIAGNOSIS — R7303 Prediabetes: Secondary | ICD-10-CM | POA: Insufficient documentation

## 2021-12-17 DIAGNOSIS — K219 Gastro-esophageal reflux disease without esophagitis: Secondary | ICD-10-CM | POA: Diagnosis not present

## 2021-12-17 DIAGNOSIS — Z8673 Personal history of transient ischemic attack (TIA), and cerebral infarction without residual deficits: Secondary | ICD-10-CM | POA: Diagnosis not present

## 2021-12-17 DIAGNOSIS — I119 Hypertensive heart disease without heart failure: Secondary | ICD-10-CM | POA: Insufficient documentation

## 2021-12-17 DIAGNOSIS — E785 Hyperlipidemia, unspecified: Secondary | ICD-10-CM | POA: Diagnosis not present

## 2021-12-17 DIAGNOSIS — Z01818 Encounter for other preprocedural examination: Secondary | ICD-10-CM | POA: Insufficient documentation

## 2021-12-17 DIAGNOSIS — Z79890 Hormone replacement therapy (postmenopausal): Secondary | ICD-10-CM | POA: Diagnosis not present

## 2021-12-17 DIAGNOSIS — I6529 Occlusion and stenosis of unspecified carotid artery: Secondary | ICD-10-CM | POA: Diagnosis present

## 2021-12-17 DIAGNOSIS — I6521 Occlusion and stenosis of right carotid artery: Secondary | ICD-10-CM | POA: Insufficient documentation

## 2021-12-17 DIAGNOSIS — E119 Type 2 diabetes mellitus without complications: Secondary | ICD-10-CM | POA: Diagnosis not present

## 2021-12-17 DIAGNOSIS — Z79899 Other long term (current) drug therapy: Secondary | ICD-10-CM | POA: Diagnosis not present

## 2021-12-17 DIAGNOSIS — M1990 Unspecified osteoarthritis, unspecified site: Secondary | ICD-10-CM | POA: Diagnosis not present

## 2021-12-17 DIAGNOSIS — Z8249 Family history of ischemic heart disease and other diseases of the circulatory system: Secondary | ICD-10-CM | POA: Diagnosis not present

## 2021-12-17 DIAGNOSIS — E039 Hypothyroidism, unspecified: Secondary | ICD-10-CM | POA: Diagnosis not present

## 2021-12-17 DIAGNOSIS — I9581 Postprocedural hypotension: Secondary | ICD-10-CM | POA: Diagnosis not present

## 2021-12-17 DIAGNOSIS — Z833 Family history of diabetes mellitus: Secondary | ICD-10-CM | POA: Diagnosis not present

## 2021-12-17 DIAGNOSIS — I4891 Unspecified atrial fibrillation: Secondary | ICD-10-CM | POA: Diagnosis not present

## 2021-12-17 DIAGNOSIS — I452 Bifascicular block: Secondary | ICD-10-CM | POA: Diagnosis not present

## 2021-12-17 DIAGNOSIS — Z7982 Long term (current) use of aspirin: Secondary | ICD-10-CM | POA: Diagnosis not present

## 2021-12-17 DIAGNOSIS — I10 Essential (primary) hypertension: Secondary | ICD-10-CM | POA: Diagnosis not present

## 2021-12-17 HISTORY — DX: Inflammatory liver disease, unspecified: K75.9

## 2021-12-17 LAB — COMPREHENSIVE METABOLIC PANEL
ALT: 25 U/L (ref 0–44)
AST: 22 U/L (ref 15–41)
Albumin: 4.3 g/dL (ref 3.5–5.0)
Alkaline Phosphatase: 51 U/L (ref 38–126)
Anion gap: 7 (ref 5–15)
BUN: 12 mg/dL (ref 8–23)
CO2: 29 mmol/L (ref 22–32)
Calcium: 9.6 mg/dL (ref 8.9–10.3)
Chloride: 105 mmol/L (ref 98–111)
Creatinine, Ser: 0.98 mg/dL (ref 0.61–1.24)
GFR, Estimated: >60 mL/min (ref >60)
Glucose, Bld: 96 mg/dL (ref 70–99)
Potassium: 3.8 mmol/L (ref 3.5–5.1)
Sodium: 141 mmol/L (ref 135–145)
Total Bilirubin: 0.5 mg/dL (ref 0.3–1.2)
Total Protein: 7.5 g/dL (ref 6.5–8.1)

## 2021-12-17 LAB — CBC
HCT: 45.7 % (ref 39.0–52.0)
Hemoglobin: 15.4 g/dL (ref 13.0–17.0)
MCH: 31.3 pg (ref 26.0–34.0)
MCHC: 33.7 g/dL (ref 30.0–36.0)
MCV: 92.9 fL (ref 80.0–100.0)
Platelets: 228 10*3/uL (ref 150–400)
RBC: 4.92 MIL/uL (ref 4.22–5.81)
RDW: 13.7 % (ref 11.5–15.5)
WBC: 7.6 10*3/uL (ref 4.0–10.5)
nRBC: 0 % (ref 0.0–0.2)

## 2021-12-17 LAB — APTT: aPTT: 28 seconds (ref 24–36)

## 2021-12-17 LAB — URINALYSIS, ROUTINE W REFLEX MICROSCOPIC
Bacteria, UA: NONE SEEN
Bilirubin Urine: NEGATIVE
Glucose, UA: NEGATIVE mg/dL
Hgb urine dipstick: NEGATIVE
Ketones, ur: NEGATIVE mg/dL
Nitrite: NEGATIVE
Protein, ur: NEGATIVE mg/dL
Specific Gravity, Urine: 1.016 (ref 1.005–1.030)
pH: 6 (ref 5.0–8.0)

## 2021-12-17 LAB — PROTIME-INR
INR: 1.1 (ref 0.8–1.2)
Prothrombin Time: 13.6 seconds (ref 11.4–15.2)

## 2021-12-17 LAB — TYPE AND SCREEN
ABO/RH(D): A POS
Antibody Screen: NEGATIVE

## 2021-12-17 LAB — SURGICAL PCR SCREEN
MRSA, PCR: NEGATIVE
Staphylococcus aureus: NEGATIVE

## 2021-12-17 NOTE — Progress Notes (Signed)
Spoke with Dr. Virl Cagey. Informed him that pt had moderate leukocytes on UA, pt asymptomatic. He said they would address this in the morning. ?

## 2021-12-17 NOTE — Progress Notes (Signed)
PCP - Dr. Burnard Bunting ?Cardiologist - denies ? ?PPM/ICD - denies ? ? ?Chest x-ray - pt states he had one about 15-20 years ago, no abnormalities ?EKG - 12/17/21 at PAT ?Stress Test - 10+ years ago per pt, no abnormalities ?ECHO - 12/10/21 ?Cardiac Cath - denies ? ?Sleep Study - denies ? ? ?DM- denies ? ?ASA/Blood Thinner Instructions: Continue ASA and Plavix  ? ? ?ERAS Protcol - no, NPO ? ? ?COVID TEST- n/a ? ? ?Anesthesia review: yes, EKG ? ?Patient denies shortness of breath, fever, cough and chest pain at PAT appointment ? ? ?All instructions explained to the patient, with a verbal understanding of the material. Patient agrees to go over the instructions while at home for a better understanding. Patient also instructed to notify surgeon of any contact with COVID+ person or if he develops any symptoms. The opportunity to ask questions was provided. ?  ?

## 2021-12-17 NOTE — Progress Notes (Signed)
Case: [id number] Date/Time: 12/18/21 0715  ? Procedure: Right Transcarotid Artery Revascularization (Right)  ? Anesthesia type: General  ? Pre-op diagnosis: Right carotid artery stenosis  ? Location: [facility] OR ROOM 16 / [facility] OR  ? Surgeons: Broadus John, MD  ? ?  ? ? ?DISCUSSION: Patient is an 86 year old male scheduled for the above procedure. By notes, he had an out-patient head CT ordered by his PCP Dr. Reynaldo Minium on 12/05/21 for left hand weakness/clumsiness with results showing atrophy with small vessel chronic ischemic changes and old lacunar infarct on the left basal ganglia but no acute intracranial abnormalities.  However, he had a follow-up brain MRI on 12/09/2021 that showed a few small acute infarctions at the right posterior frontal vertex and right parietal deep white matter, so he was referred to the [facility] ED for further evaluation and later transferred to [facility]. Carotid imaging revealed 80% RICA stenosis. Neurology and vascular surgery consulted. Right TCAR planned for 12/18/21.  Dual antiplatelet therapy and high intensity statin continued at 12/11/21 discharge. ? 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 ? ?EKG showed SB at 55, bifascicular block. Recent echo showed normal LV/RV function, LVEF 60-65%, no regional wall motion abnormalities, mild AS, grossly normal atrial septum. He denied chest pain and SOB. ? ?He has symptomatic RICA stenosis (CVA earlier this month) with TCAR planned on 12/18/21. Anesthesia team to evaluate on the day of surgery.  He is to continue aspirin and Plavix perioperatively. ?   ? ?VS: BP (!) 145/79   Pulse 70   Temp 36.7 ?C   Resp 17   Ht 6' (1.829 m)   Wt 81.6 kg   SpO2 97%   BMI 24.41 kg/m?  ? ?PROVIDERS: ?Burnard Bunting, MD is PCP  ? ? ?LABS: Labs reviewed: Acceptable  for surgery. A1c 6.3% 12/09/21. ?(all labs ordered are listed, but only abnormal results are displayed) ? ?Labs Reviewed  ?URINALYSIS, ROUTINE W REFLEX MICROSCOPIC - Abnormal; Notable for the following components:  ?    Result Value  ? Leukocytes,Ua MODERATE (*)   ? All other components within normal limits  ?SURGICAL PCR SCREEN  ?CBC  ?COMPREHENSIVE METABOLIC PANEL  ?PROTIME-INR  ?APTT  ?TYPE AND SCREEN  ? ? ? ?IMAGES: ?CT Head & CTA head/neck 12/09/21: ?IMPRESSION: ?1. Negative CTA for large vessel occlusion or other emergent ?finding. ?2. Extensive bulky calcified plaque about the right carotid ?bulb/proximal right ICA with associated severe stenosis of up to 80% ?by NASCET criteria. ?3. 50% atheromatous stenosis about the left carotid bifurcation. ?4. Atheromatous plaque at the origins of both vertebral arteries ?with associated severe ostial stenoses. ?5. Moderate to advanced atheromatous irregularity involving the ?right PCA with associated moderate to severe multifocal right P2 ?stenoses. ? ?MRI Brain 12/09/21: ?IMPRESSION: ?- Few small acute infarctions at the right posterior frontal vertex, ?probably affecting the pre central gyrus. No mass effect or ?hemorrhage. Minimal enhancement at those sites, often seen in acute ?to subacute infarctions. Few small acute infarctions also affecting ?the right parietal deep white matter in the forceps major region. No ?large infarction. ?- Extensive chronic small-vessel ischemic changes elsewhere throughout ?the brain as outlined above. Old infarction at the right posterior ?parietal/occipital junction. ? ?CT Head 12/05/21: ?IMPRESSION: ?- Atrophy with small vessel chronic ischemic changes of deep cerebral ?white matter. ?- Old lacunar infarct LEFT basal  ganglia extending into caudate. ?- No acute intracranial abnormalities. ?  ? ?EKG: 12/17/21:  ?Sinus bradycardia at 55 bpm ?Right bundle branch block ?Left anterior fascicular block ?** Bifascicular block ** ?Minimal voltage  criteria for LVH, may be normal variant ( R in aVL ) ?Abnormal ECG ?No previous ECGs available ? ? ?CV: ?Echo 12/10/21: ?IMPRESSIONS  ? 1. Left ventricular ejection fraction, by estimation, is 60 to 65%. The  ?left ventricle has normal function. The left ventricle has no regional  ?wall motion abnormalities. There is mild concentric left ventricular  ?hypertrophy. Left ventricular diastolic  ?parameters are consistent with Grade I diastolic dysfunction (impaired  ?relaxation). Elevated left atrial pressure.  ? 2. Right ventricular systolic function is normal. The right ventricular  ?size is normal.  ? 3. The mitral valve is normal in structure. Trivial mitral valve  ?regurgitation. No evidence of mitral stenosis.  ? 4. The aortic valve is tricuspid. There is moderate calcification of the  ?aortic valve. There is moderate thickening of the aortic valve. Aortic  ?valve regurgitation is not visualized. Mild aortic valve stenosis. Aortic  ?valve area, by VTI measures 1.43  ?cm?Marland Kitchen Aortic valve mean gradient measures 14.0 mmHg. Aortic valve Vmax  ?measures 2.60 m/s.  ? 5. Aortic dilatation noted. There is borderline dilatation of the  ?ascending aorta, measuring 36 mm.  ? 6. The inferior vena cava is dilated in size with >50% respiratory  ?variability, suggesting right atrial pressure of 8 mmHg.  ?- Comparison(s): No prior Echocardiogram.  ?- Conclusion(s)/Recommendation(s): No intracardiac source of embolism  ?detected on this transthoracic study. Consider a transesophageal  ?echocardiogram to exclude cardiac source of embolism if clinically  ?indicated.  ? ? ?He reported a normal stress test greater than 10 years ago. ? ?Past Medical History:  ?Diagnosis Date  ? Arthritis   ? Colon polyps   ? Diabetes ([place])   ? pt denies, and does not take any medications  ? GERD (gastroesophageal reflux disease)   ? Hepatitis   ? hx of Hepatitis A  ? Hyperlipidemia   ? Hypertension   ? Hypothyroid   ? Osteoarthritis   ? TIA (transient  ischemic attack) 1980  ?  ?Past Surgical History:  ?Procedure Laterality Date  ? THYROIDECTOMY  2008  ? ? ?MEDICATIONS: ? amLODipine (NORVASC) 10 MG tablet  ? aspirin 81 MG chewable tablet  ? benazepril (LOTENSIN) 20 MG tablet  ? bisacodyl (DULCOLAX) 5 MG EC tablet  ? clopidogrel (PLAVIX) 75 MG tablet  ? COVID-19 mRNA bivalent vaccine, Pfizer, (PFIZER COVID-19 VAC BIVALENT) injection  ? COVID-19 mRNA Vac-TriS, Pfizer, (PFIZER-BIONT COVID-19 VAC-TRIS) SUSP injection  ? esomeprazole (NEXIUM) 40 MG capsule  ? ezetimibe (ZETIA) 10 MG tablet  ? influenza vaccine adjuvanted (FLUAD QUADRIVALENT) 0.5 ML injection  ? levothyroxine (SYNTHROID) 150 MCG tablet  ? oxybutynin (DITROPAN-XL) 5 MG 24 hr tablet  ? pantoprazole (PROTONIX) 40 MG tablet  ? rosuvastatin (CRESTOR) 40 MG tablet  ? valACYclovir (VALTREX) 1000 MG tablet  ? ? 0.9 %  sodium chloride infusion  ? ? ?Myra Gianotti, PA-C ?Surgical Short Stay/Anesthesiology ?[facility] Phone [phone] ?[facility] Phone [phone] ?12/17/2021 3:57 PM ? ? ? ? ? ? ?

## 2021-12-17 NOTE — Anesthesia Preprocedure Evaluation (Addendum)
Anesthesia Evaluation  Patient identified by MRN, date of birth, ID band Patient awake    Reviewed: Allergy & Precautions, H&P , NPO status , Patient's Chart, lab work & pertinent test results  Airway Mallampati: III  TM Distance: >3 FB Neck ROM: Full    Dental no notable dental hx. (+) Teeth Intact, Dental Advisory Given   Pulmonary neg pulmonary ROS,    Pulmonary exam normal breath sounds clear to auscultation       Cardiovascular hypertension, Pt. on medications  Rhythm:Regular Rate:Normal  Echo 12/10/21: IMPRESSIONS  1. Left ventricular ejection fraction, by estimation, is 60 to 65%. The  left ventricle has normal function. The left ventricle has no regional  wall motion abnormalities. There is mild concentric left ventricular  hypertrophy. Left ventricular diastolic  parameters are consistent with Grade I diastolic dysfunction (impaired  relaxation). Elevated left atrial pressure.  2. Right ventricular systolic function is normal. The right ventricular  size is normal.  3. The mitral valve is normal in structure. Trivial mitral valve  regurgitation. No evidence of mitral stenosis.  4. The aortic valve is tricuspid. There is moderate calcification of the  aortic valve. There is moderate thickening of the aortic valve. Aortic  valve regurgitation is not visualized. Mild aortic valve stenosis. Aortic  valve area, by VTI measures 1.43  cm. Aortic valve mean gradient measures 14.0 mmHg. Aortic valve Vmax  measures 2.60 m/s.  5. Aortic dilatation noted. There is borderline dilatation of the  ascending aorta, measuring 36 mm.  6. The inferior vena cava is dilated in size with >50% respiratory  variability, suggesting right atrial pressure of 8 mmHg.     Neuro/Psych TIAnegative psych ROS   GI/Hepatic Neg liver ROS, GERD  Medicated and Controlled,  Endo/Other  diabetesHypothyroidism   Renal/GU negative Renal ROS   negative genitourinary   Musculoskeletal   Abdominal   Peds  Hematology negative hematology ROS (+)   Anesthesia Other Findings   Reproductive/Obstetrics negative OB ROS                           Anesthesia Physical Anesthesia Plan  ASA: 3  Anesthesia Plan: General   Post-op Pain Management: Tylenol PO (pre-op)*   Induction: Intravenous  PONV Risk Score and Plan: 3 and Ondansetron, Dexamethasone and Treatment may vary due to age or medical condition  Airway Management Planned: Oral ETT  Additional Equipment: Arterial line  Intra-op Plan:   Post-operative Plan: Extubation in OR  Informed Consent: I have reviewed the patients History and Physical, chart, labs and discussed the procedure including the risks, benefits and alternatives for the proposed anesthesia with the patient or authorized representative who has indicated his/her understanding and acceptance.     Dental advisory given  Plan Discussed with: CRNA  Anesthesia Plan Comments: (PAT note written 12/17/2021 by Myra Gianotti, PA-C. )       Anesthesia Quick Evaluation

## 2021-12-18 ENCOUNTER — Other Ambulatory Visit: Payer: Self-pay

## 2021-12-18 ENCOUNTER — Inpatient Hospital Stay (HOSPITAL_COMMUNITY): Payer: Medicare Other

## 2021-12-18 ENCOUNTER — Inpatient Hospital Stay (HOSPITAL_COMMUNITY)
Admission: RE | Admit: 2021-12-18 | Discharge: 2021-12-20 | DRG: 035 | Disposition: A | Discharge status: Home / Self Care | Payer: Medicare Other | Attending: Vascular Surgery | Admitting: Vascular Surgery

## 2021-12-18 ENCOUNTER — Encounter (HOSPITAL_COMMUNITY)
Admission: RE | Disposition: A | Discharge status: Home / Self Care | Payer: Self-pay | Source: Home / Self Care | Attending: Vascular Surgery

## 2021-12-18 ENCOUNTER — Inpatient Hospital Stay (HOSPITAL_COMMUNITY): Payer: Medicare Other | Admitting: Vascular Surgery

## 2021-12-18 ENCOUNTER — Encounter (HOSPITAL_COMMUNITY): Payer: Self-pay | Admitting: Vascular Surgery

## 2021-12-18 DIAGNOSIS — M1990 Unspecified osteoarthritis, unspecified site: Secondary | ICD-10-CM | POA: Diagnosis present

## 2021-12-18 DIAGNOSIS — Z79890 Hormone replacement therapy (postmenopausal): Secondary | ICD-10-CM | POA: Diagnosis not present

## 2021-12-18 DIAGNOSIS — I10 Essential (primary) hypertension: Secondary | ICD-10-CM | POA: Diagnosis present

## 2021-12-18 DIAGNOSIS — I6521 Occlusion and stenosis of right carotid artery: Principal | ICD-10-CM | POA: Diagnosis present

## 2021-12-18 DIAGNOSIS — E119 Type 2 diabetes mellitus without complications: Secondary | ICD-10-CM | POA: Diagnosis present

## 2021-12-18 DIAGNOSIS — I4891 Unspecified atrial fibrillation: Secondary | ICD-10-CM | POA: Diagnosis not present

## 2021-12-18 DIAGNOSIS — E039 Hypothyroidism, unspecified: Secondary | ICD-10-CM

## 2021-12-18 DIAGNOSIS — Z8249 Family history of ischemic heart disease and other diseases of the circulatory system: Secondary | ICD-10-CM | POA: Diagnosis not present

## 2021-12-18 DIAGNOSIS — I6529 Occlusion and stenosis of unspecified carotid artery: Secondary | ICD-10-CM | POA: Diagnosis present

## 2021-12-18 DIAGNOSIS — I9581 Postprocedural hypotension: Secondary | ICD-10-CM | POA: Diagnosis not present

## 2021-12-18 DIAGNOSIS — Z833 Family history of diabetes mellitus: Secondary | ICD-10-CM

## 2021-12-18 DIAGNOSIS — R195 Other fecal abnormalities: Secondary | ICD-10-CM

## 2021-12-18 DIAGNOSIS — K219 Gastro-esophageal reflux disease without esophagitis: Secondary | ICD-10-CM | POA: Diagnosis present

## 2021-12-18 DIAGNOSIS — Z7982 Long term (current) use of aspirin: Secondary | ICD-10-CM

## 2021-12-18 DIAGNOSIS — Z79899 Other long term (current) drug therapy: Secondary | ICD-10-CM | POA: Diagnosis not present

## 2021-12-18 DIAGNOSIS — Z8673 Personal history of transient ischemic attack (TIA), and cerebral infarction without residual deficits: Secondary | ICD-10-CM

## 2021-12-18 DIAGNOSIS — I452 Bifascicular block: Secondary | ICD-10-CM | POA: Diagnosis present

## 2021-12-18 DIAGNOSIS — E890 Postprocedural hypothyroidism: Secondary | ICD-10-CM | POA: Diagnosis present

## 2021-12-18 DIAGNOSIS — E785 Hyperlipidemia, unspecified: Secondary | ICD-10-CM | POA: Diagnosis present

## 2021-12-18 HISTORY — PX: TRANSCAROTID ARTERY REVASCULARIZATIONÂ: SHX6778

## 2021-12-18 LAB — POCT ACTIVATED CLOTTING TIME
Activated Clotting Time: 131 seconds
Activated Clotting Time: 167 seconds
Activated Clotting Time: 329 seconds

## 2021-12-18 LAB — ABO/RH: ABO/RH(D): A POS

## 2021-12-18 LAB — GLUCOSE, CAPILLARY: Glucose-Capillary: 126 mg/dL — ABNORMAL HIGH (ref 70–99)

## 2021-12-18 SURGERY — TRANSCAROTID ARTERY REVASCULARIZATION (TCAR)
Anesthesia: General | Laterality: Right

## 2021-12-18 MED ORDER — IODIXANOL 320 MG/ML IV SOLN
INTRAVENOUS | Status: DC | PRN
Start: 2021-12-18 — End: 2021-12-18
  Administered 2021-12-18: 45 mL via INTRA_ARTERIAL

## 2021-12-18 MED ORDER — 0.9 % SODIUM CHLORIDE (POUR BTL) OPTIME
TOPICAL | Status: DC | PRN
Start: 2021-12-18 — End: 2021-12-18
  Administered 2021-12-18: 1000 mL

## 2021-12-18 MED ORDER — INFLUENZA VAC A&B SA ADJ QUAD 0.5 ML IM PRSY: 0.5000 mL | PREFILLED_SYRINGE | Freq: Once | INTRAMUSCULAR | Status: DC

## 2021-12-18 MED ORDER — ROSUVASTATIN CALCIUM 20 MG PO TABS
40.0000 mg | ORAL_TABLET | Freq: Every day | ORAL | Status: DC
  Administered 2021-12-19 – 2021-12-20 (×2): 40 mg via ORAL
  Filled 2021-12-18 (×2): qty 2

## 2021-12-18 MED ORDER — BISACODYL 5 MG PO TBEC
10.0000 mg | DELAYED_RELEASE_TABLET | ORAL | Status: DC
  Administered 2021-12-19: 10 mg via ORAL
  Filled 2021-12-18: qty 2

## 2021-12-18 MED ORDER — HEPARIN SODIUM (PORCINE) 5000 UNIT/ML IJ SOLN
5000.0000 [IU] | Freq: Three times a day (TID) | INTRAMUSCULAR | Status: DC
  Administered 2021-12-18 – 2021-12-20 (×6): 5000 [IU] via SUBCUTANEOUS
  Filled 2021-12-18 (×6): qty 1

## 2021-12-18 MED ORDER — DEXAMETHASONE SODIUM PHOSPHATE 10 MG/ML IJ SOLN
INTRAMUSCULAR | Status: DC | PRN
  Administered 2021-12-18: 10 mg via INTRAVENOUS

## 2021-12-18 MED ORDER — CEFAZOLIN SODIUM-DEXTROSE 2-4 GM/100ML-% IV SOLN
2.0000 g | INTRAVENOUS | Status: AC
  Administered 2021-12-18: 2 g via INTRAVENOUS

## 2021-12-18 MED ORDER — DOCUSATE SODIUM 100 MG PO CAPS
100.0000 mg | ORAL_CAPSULE | Freq: Every day | ORAL | Status: DC
  Administered 2021-12-19: 100 mg via ORAL
  Filled 2021-12-18 (×2): qty 1

## 2021-12-18 MED ORDER — FENTANYL CITRATE (PF) 250 MCG/5ML IJ SOLN
INTRAMUSCULAR | Status: AC
  Filled 2021-12-18: qty 5

## 2021-12-18 MED ORDER — ACETAMINOPHEN 500 MG PO TABS: 1000.0000 mg | ORAL_TABLET | Freq: Once | ORAL | Status: DC

## 2021-12-18 MED ORDER — PANTOPRAZOLE SODIUM 40 MG PO TBEC: 40.0000 mg | DELAYED_RELEASE_TABLET | Freq: Every day | ORAL | Status: DC

## 2021-12-18 MED ORDER — SODIUM CHLORIDE 0.9 % IV BOLUS
500.0000 mL | Freq: Once | INTRAVENOUS | Status: AC
  Administered 2021-12-18: 500 mL via INTRAVENOUS

## 2021-12-18 MED ORDER — CHLORHEXIDINE GLUCONATE 0.12 % MT SOLN: 15.0000 mL | Freq: Once | OROMUCOSAL | Status: AC

## 2021-12-18 MED ORDER — ACETAMINOPHEN 10 MG/ML IV SOLN
INTRAVENOUS | Status: DC | PRN
  Administered 2021-12-18: 1000 mg via INTRAVENOUS

## 2021-12-18 MED ORDER — ALUM & MAG HYDROXIDE-SIMETH 200-200-20 MG/5ML PO SUSP: 15.0000 mL | ORAL | Status: DC | PRN

## 2021-12-18 MED ORDER — SODIUM CHLORIDE 0.9 % IV SOLN: INTRAVENOUS | Status: DC

## 2021-12-18 MED ORDER — CLOPIDOGREL BISULFATE 75 MG PO TABS
75.0000 mg | ORAL_TABLET | Freq: Every day | ORAL | Status: DC
  Administered 2021-12-19 – 2021-12-20 (×2): 75 mg via ORAL
  Filled 2021-12-18 (×2): qty 1

## 2021-12-18 MED ORDER — GLYCOPYRROLATE 0.2 MG/ML IJ SOLN
INTRAMUSCULAR | Status: DC | PRN
Start: 2021-12-18 — End: 2021-12-18
  Administered 2021-12-18 (×2): .1 mg via INTRAVENOUS

## 2021-12-18 MED ORDER — PROTAMINE SULFATE 10 MG/ML IV SOLN
INTRAVENOUS | Status: DC | PRN
  Administered 2021-12-18: 50 mg via INTRAVENOUS

## 2021-12-18 MED ORDER — EPHEDRINE SULFATE-NACL 50-0.9 MG/10ML-% IV SOSY
PREFILLED_SYRINGE | INTRAVENOUS | Status: DC | PRN
  Administered 2021-12-18 (×2): 2.5 mg via INTRAVENOUS
  Administered 2021-12-18: 5 mg via INTRAVENOUS

## 2021-12-18 MED ORDER — ACETAMINOPHEN 650 MG RE SUPP: 325.0000 mg | RECTAL | Status: DC | PRN

## 2021-12-18 MED ORDER — PROPOFOL 10 MG/ML IV BOLUS
INTRAVENOUS | Status: AC
  Filled 2021-12-18: qty 20

## 2021-12-18 MED ORDER — CHLORHEXIDINE GLUCONATE CLOTH 2 % EX PADS: 6.0000 | MEDICATED_PAD | Freq: Once | CUTANEOUS | Status: DC

## 2021-12-18 MED ORDER — HEPARIN SODIUM (PORCINE) 1000 UNIT/ML IJ SOLN
INTRAMUSCULAR | Status: DC | PRN
  Administered 2021-12-18: 5000 [IU] via INTRAVENOUS
  Administered 2021-12-18: 8000 [IU] via INTRAVENOUS

## 2021-12-18 MED ORDER — GUAIFENESIN-DM 100-10 MG/5ML PO SYRP: 15.0000 mL | ORAL_SOLUTION | ORAL | Status: DC | PRN

## 2021-12-18 MED ORDER — PHENYLEPHRINE HCL-NACL 20-0.9 MG/250ML-% IV SOLN
INTRAVENOUS | Status: AC
  Filled 2021-12-18: qty 750

## 2021-12-18 MED ORDER — COVID-19MRNA BIVAL VACC PFIZER 30 MCG/0.3ML IM SUSP: 0.3000 mL | Freq: Once | INTRAMUSCULAR | Status: DC

## 2021-12-18 MED ORDER — BENAZEPRIL HCL 5 MG PO TABS
10.0000 mg | ORAL_TABLET | Freq: Two times a day (BID) | ORAL | Status: DC
  Filled 2021-12-18 (×2): qty 2

## 2021-12-18 MED ORDER — ORAL CARE MOUTH RINSE: 15.0000 mL | Freq: Once | OROMUCOSAL | Status: AC

## 2021-12-18 MED ORDER — ACETAMINOPHEN 325 MG PO TABS
325.0000 mg | ORAL_TABLET | ORAL | Status: DC | PRN
  Administered 2021-12-19: 650 mg via ORAL
  Filled 2021-12-18: qty 2

## 2021-12-18 MED ORDER — PHENYLEPHRINE 80 MCG/ML (10ML) SYRINGE FOR IV PUSH (FOR BLOOD PRESSURE SUPPORT)
PREFILLED_SYRINGE | INTRAVENOUS | Status: DC | PRN
  Administered 2021-12-18: 160 ug via INTRAVENOUS
  Administered 2021-12-18 (×2): 40 ug via INTRAVENOUS

## 2021-12-18 MED ORDER — VALACYCLOVIR HCL 500 MG PO TABS: 1000.0000 mg | ORAL_TABLET | Freq: Every day | ORAL | Status: DC | PRN

## 2021-12-18 MED ORDER — LIDOCAINE 2% (20 MG/ML) 5 ML SYRINGE
INTRAMUSCULAR | Status: DC | PRN
Start: 2021-12-18 — End: 2021-12-18
  Administered 2021-12-18: 60 mg via INTRAVENOUS

## 2021-12-18 MED ORDER — MAGNESIUM SULFATE 2 GM/50ML IV SOLN: 2.0000 g | Freq: Every day | INTRAVENOUS | Status: DC | PRN

## 2021-12-18 MED ORDER — CHLORHEXIDINE GLUCONATE 0.12 % MT SOLN
OROMUCOSAL | Status: AC
  Administered 2021-12-18: 15 mL via OROMUCOSAL
  Filled 2021-12-18: qty 15

## 2021-12-18 MED ORDER — POTASSIUM CHLORIDE CRYS ER 20 MEQ PO TBCR: 20.0000 meq | EXTENDED_RELEASE_TABLET | Freq: Every day | ORAL | Status: DC | PRN

## 2021-12-18 MED ORDER — HEMOSTATIC AGENTS (NO CHARGE) OPTIME
TOPICAL | Status: DC | PRN
  Administered 2021-12-18: 1 via TOPICAL

## 2021-12-18 MED ORDER — CEFAZOLIN SODIUM-DEXTROSE 2-4 GM/100ML-% IV SOLN
2.0000 g | Freq: Three times a day (TID) | INTRAVENOUS | Status: AC
  Administered 2021-12-18 (×2): 2 g via INTRAVENOUS
  Filled 2021-12-18 (×2): qty 100

## 2021-12-18 MED ORDER — LIDOCAINE-EPINEPHRINE (PF) 1 %-1:200000 IJ SOLN
INTRAMUSCULAR | Status: AC
  Filled 2021-12-18: qty 30

## 2021-12-18 MED ORDER — HEPARIN 6000 UNIT IRRIGATION SOLUTION
Status: AC
  Filled 2021-12-18: qty 500

## 2021-12-18 MED ORDER — EZETIMIBE 10 MG PO TABS
10.0000 mg | ORAL_TABLET | Freq: Every day | ORAL | Status: DC
  Administered 2021-12-19 – 2021-12-20 (×2): 10 mg via ORAL
  Filled 2021-12-18 (×2): qty 1

## 2021-12-18 MED ORDER — LIDOCAINE HCL (PF) 1 % IJ SOLN
INTRAMUSCULAR | Status: AC
  Filled 2021-12-18: qty 30

## 2021-12-18 MED ORDER — OXYBUTYNIN CHLORIDE ER 5 MG PO TB24
5.0000 mg | ORAL_TABLET | Freq: Every day | ORAL | Status: DC
  Administered 2021-12-19 – 2021-12-20 (×2): 5 mg via ORAL
  Filled 2021-12-18 (×2): qty 1

## 2021-12-18 MED ORDER — NITROGLYCERIN IN D5W 200-5 MCG/ML-% IV SOLN
INTRAVENOUS | Status: AC
  Filled 2021-12-18: qty 250

## 2021-12-18 MED ORDER — PHENOL 1.4 % MT LIQD: 1.0000 | OROMUCOSAL | Status: DC | PRN

## 2021-12-18 MED ORDER — ONDANSETRON HCL 4 MG/2ML IJ SOLN: 4.0000 mg | Freq: Four times a day (QID) | INTRAMUSCULAR | Status: DC | PRN

## 2021-12-18 MED ORDER — SODIUM CHLORIDE 0.9 % IV SOLN
500.0000 mL | Freq: Once | INTRAVENOUS | Status: AC | PRN
  Administered 2021-12-19: 500 mL via INTRAVENOUS

## 2021-12-18 MED ORDER — PHENYLEPHRINE HCL-NACL 20-0.9 MG/250ML-% IV SOLN
INTRAVENOUS | Status: DC | PRN
Start: 2021-12-18 — End: 2021-12-18
  Administered 2021-12-18: 10 ug/min via INTRAVENOUS
  Administered 2021-12-18: 20 ug/min via INTRAVENOUS

## 2021-12-18 MED ORDER — FENTANYL CITRATE (PF) 100 MCG/2ML IJ SOLN: 25.0000 ug | INTRAMUSCULAR | Status: DC | PRN

## 2021-12-18 MED ORDER — LACTATED RINGERS IV SOLN
INTRAVENOUS | Status: DC | PRN
Start: 2021-12-18 — End: 2021-12-18

## 2021-12-18 MED ORDER — FENTANYL CITRATE (PF) 250 MCG/5ML IJ SOLN
INTRAMUSCULAR | Status: DC | PRN
  Administered 2021-12-18: 50 ug via INTRAVENOUS

## 2021-12-18 MED ORDER — STERILE WATER FOR IRRIGATION IR SOLN
Status: DC | PRN
  Administered 2021-12-18: 1000 mL

## 2021-12-18 MED ORDER — DOPAMINE-DEXTROSE 3.2-5 MG/ML-% IV SOLN
INTRAVENOUS | Status: AC
  Administered 2021-12-18: 3 ug/kg/min via INTRAVENOUS
  Filled 2021-12-18: qty 250

## 2021-12-18 MED ORDER — METOPROLOL TARTRATE 5 MG/5ML IV SOLN: 2.0000 mg | INTRAVENOUS | Status: DC | PRN

## 2021-12-18 MED ORDER — PROPOFOL 10 MG/ML IV BOLUS
INTRAVENOUS | Status: DC | PRN
  Administered 2021-12-18: 160 mg via INTRAVENOUS

## 2021-12-18 MED ORDER — DOPAMINE-DEXTROSE 3.2-5 MG/ML-% IV SOLN: 3.0000 ug/kg/min | INTRAVENOUS | Status: DC

## 2021-12-18 MED ORDER — AMLODIPINE BESYLATE 10 MG PO TABS
10.0000 mg | ORAL_TABLET | Freq: Every day | ORAL | Status: DC
  Administered 2021-12-20: 10 mg via ORAL
  Filled 2021-12-18: qty 1

## 2021-12-18 MED ORDER — CLEVIDIPINE BUTYRATE 0.5 MG/ML IV EMUL
INTRAVENOUS | Status: DC | PRN
Start: 2021-12-18 — End: 2021-12-18
  Administered 2021-12-18: 1 mg/h via INTRAVENOUS

## 2021-12-18 MED ORDER — ASPIRIN 81 MG PO CHEW
81.0000 mg | CHEWABLE_TABLET | Freq: Every day | ORAL | Status: DC
Start: 2021-12-19 — End: 2021-12-20
  Administered 2021-12-19 – 2021-12-20 (×2): 81 mg via ORAL
  Filled 2021-12-18 (×2): qty 1

## 2021-12-18 MED ORDER — ROCURONIUM BROMIDE 10 MG/ML (PF) SYRINGE
PREFILLED_SYRINGE | INTRAVENOUS | Status: DC | PRN
Start: 2021-12-18 — End: 2021-12-18
  Administered 2021-12-18: 20 mg via INTRAVENOUS
  Administered 2021-12-18: 60 mg via INTRAVENOUS

## 2021-12-18 MED ORDER — HYDRALAZINE HCL 20 MG/ML IJ SOLN: 5.0000 mg | INTRAMUSCULAR | Status: DC | PRN

## 2021-12-18 MED ORDER — ACETAMINOPHEN 10 MG/ML IV SOLN
INTRAVENOUS | Status: AC
  Filled 2021-12-18: qty 100

## 2021-12-18 MED ORDER — ONDANSETRON HCL 4 MG/2ML IJ SOLN
INTRAMUSCULAR | Status: DC | PRN
  Administered 2021-12-18: 4 mg via INTRAVENOUS

## 2021-12-18 MED ORDER — LACTATED RINGERS IV SOLN: INTRAVENOUS | Status: DC

## 2021-12-18 MED ORDER — SUGAMMADEX SODIUM 200 MG/2ML IV SOLN
INTRAVENOUS | Status: DC | PRN
  Administered 2021-12-18: 200 mg via INTRAVENOUS

## 2021-12-18 MED ORDER — COVID-19 MRNA VAC-TRIS(PFIZER) 30 MCG/0.3ML IM SUSP: 0.3000 mL | Freq: Once | INTRAMUSCULAR | Status: DC

## 2021-12-18 MED ORDER — HEPARIN 6000 UNIT IRRIGATION SOLUTION
Status: DC | PRN
  Administered 2021-12-18: 1

## 2021-12-18 MED ORDER — LEVOTHYROXINE SODIUM 75 MCG PO TABS
150.0000 ug | ORAL_TABLET | Freq: Every morning | ORAL | Status: DC
  Administered 2021-12-19 – 2021-12-20 (×2): 150 ug via ORAL
  Filled 2021-12-18 (×2): qty 2

## 2021-12-18 MED ORDER — PANTOPRAZOLE SODIUM 40 MG PO TBEC
40.0000 mg | DELAYED_RELEASE_TABLET | Freq: Every day | ORAL | Status: DC
  Administered 2021-12-19 – 2021-12-20 (×2): 40 mg via ORAL
  Filled 2021-12-18 (×2): qty 1

## 2021-12-18 MED ORDER — CEFAZOLIN SODIUM-DEXTROSE 2-4 GM/100ML-% IV SOLN
INTRAVENOUS | Status: AC
  Filled 2021-12-18: qty 100

## 2021-12-18 MED ORDER — LABETALOL HCL 5 MG/ML IV SOLN: 10.0000 mg | INTRAVENOUS | Status: DC | PRN

## 2021-12-18 SURGICAL SUPPLY — 56 items
ADH SKN CLS APL DERMABOND .7 (GAUZE/BANDAGES/DRESSINGS) ×2
BAG COUNTER SPONGE SURGICOUNT (BAG) ×3 IMPLANT
BAG SPNG CNTER NS LX DISP (BAG) ×1
BALLN STERLING RX 6X30X80 (BALLOONS) ×2
BALLOON STERLING RX 6X30X80 (BALLOONS) IMPLANT
CANISTER SUCT 3000ML PPV (MISCELLANEOUS) ×3 IMPLANT
CLIP LIGATING EXTRA MED SLVR (CLIP) ×3 IMPLANT
CLIP LIGATING EXTRA SM BLUE (MISCELLANEOUS) ×3 IMPLANT
COVER DOME SNAP 22 D (MISCELLANEOUS) ×3 IMPLANT
COVER PROBE W GEL 5X96 (DRAPES) ×3 IMPLANT
COVER PROBE W/GEL STERILE (IV SETS) ×1 IMPLANT
DERMABOND ADVANCED (GAUZE/BANDAGES/DRESSINGS) ×2
DERMABOND ADVANCED .7 DNX12 (GAUZE/BANDAGES/DRESSINGS) ×2 IMPLANT
DRAPE FEMORAL ANGIO 80X135IN (DRAPES) ×3 IMPLANT
ELECT REM PT RETURN 9FT ADLT (ELECTROSURGICAL) ×2
ELECTRODE REM PT RTRN 9FT ADLT (ELECTROSURGICAL) ×2 IMPLANT
GAUZE 4X4 16PLY ~~LOC~~+RFID DBL (SPONGE) ×1 IMPLANT
GLOVE BIO SURGEON STRL SZ7.5 (GLOVE) ×3 IMPLANT
GLOVE SRG 8 PF TXTR STRL LF DI (GLOVE) ×2 IMPLANT
GLOVE SURG POLYISO LF SZ8 (GLOVE) IMPLANT
GLOVE SURG UNDER POLY LF SZ8 (GLOVE) ×2
GOWN STRL REUS W/ TWL LRG LVL3 (GOWN DISPOSABLE) ×4 IMPLANT
GOWN STRL REUS W/TWL 2XL LVL3 (GOWN DISPOSABLE) ×3 IMPLANT
GOWN STRL REUS W/TWL LRG LVL3 (GOWN DISPOSABLE) ×4
GUIDEWIRE ENROUTE 0.014 (WIRE) ×3 IMPLANT
HEMOSTAT SNOW SURGICEL 2X4 (HEMOSTASIS) ×1 IMPLANT
INTRODUCER KIT GALT 7CM (INTRODUCER) ×2
KIT BASIN OR (CUSTOM PROCEDURE TRAY) ×3 IMPLANT
KIT ENCORE 26 ADVANTAGE (KITS) ×3 IMPLANT
KIT INTRODUCER GALT 7 (INTRODUCER) ×2 IMPLANT
KIT TURNOVER KIT B (KITS) ×3 IMPLANT
NDL HYPO 25GX1X1/2 BEV (NEEDLE) IMPLANT
NEEDLE HYPO 25GX1X1/2 BEV (NEEDLE) IMPLANT
PACK CAROTID (CUSTOM PROCEDURE TRAY) ×3 IMPLANT
POSITIONER HEAD DONUT 9IN (MISCELLANEOUS) ×3 IMPLANT
PROTECTION STATION PRESSURIZED (MISCELLANEOUS) ×2
SET MICROPUNCTURE 5F STIFF (MISCELLANEOUS) ×3 IMPLANT
SPONGE T-LAP 18X18 ~~LOC~~+RFID (SPONGE) ×1 IMPLANT
STATION PROTECTION PRESSURIZED (MISCELLANEOUS) ×2 IMPLANT
STENT TRANSCAROTID SYS 10X40 (Permanent Stent) ×1 IMPLANT
SUT MNCRL AB 4-0 PS2 18 (SUTURE) ×3 IMPLANT
SUT PROLENE 5 0 C 1 24 (SUTURE) ×4 IMPLANT
SUT SILK 2 0 PERMA HAND 18 BK (SUTURE) IMPLANT
SUT SILK 2 0 SH (SUTURE) ×3 IMPLANT
SUT SILK 3 0 (SUTURE)
SUT SILK 3-0 18XBRD TIE 12 (SUTURE) IMPLANT
SUT VIC AB 3-0 SH 27 (SUTURE) ×2
SUT VIC AB 3-0 SH 27X BRD (SUTURE) ×2 IMPLANT
SYR 10ML LL (SYRINGE) ×9 IMPLANT
SYR 20ML LL LF (SYRINGE) ×3 IMPLANT
SYR CONTROL 10ML LL (SYRINGE) IMPLANT
SYSTEM TRANSCAROTID NEUROPRTCT (MISCELLANEOUS) ×2 IMPLANT
TOWEL GREEN STERILE (TOWEL DISPOSABLE) ×3 IMPLANT
TRANSCAROTID NEUROPROTECT SYS (MISCELLANEOUS) ×2
WATER STERILE IRR 1000ML POUR (IV SOLUTION) ×3 IMPLANT
WIRE BENTSON .035X145CM (WIRE) ×3 IMPLANT

## 2021-12-18 NOTE — Anesthesia Postprocedure Evaluation (Signed)
Anesthesia Post Note  Patient: Nathaniel Mcclain  Procedure(s) Performed: Right Transcarotid Artery Revascularization (Right)     Patient location during evaluation: PACU Anesthesia Type: General Level of consciousness: awake and alert Pain management: pain level controlled Vital Signs Assessment: post-procedure vital signs reviewed and stable Respiratory status: spontaneous breathing, nonlabored ventilation and respiratory function stable Cardiovascular status: blood pressure returned to baseline and stable Postop Assessment: no apparent nausea or vomiting Anesthetic complications: no   No notable events documented.  Last Vitals:  Vitals:   12/18/21 1125 12/18/21 1210  BP: (!) 93/51 (!) 102/52  Pulse: (!) 53 (!) 50  Resp: 13 12  Temp:    SpO2: 96% 95%    Last Pain:  Vitals:   12/18/21 1210  TempSrc:   PainSc: 0-No pain                 Angella Montas,W. EDMOND

## 2021-12-18 NOTE — Anesthesia Procedure Notes (Signed)
Procedure Name: Intubation Date/Time: 12/18/2021 7:45 AM Performed by: Justin Mend, RN Pre-anesthesia Checklist: Patient identified, Emergency Drugs available, Suction available and Patient being monitored Patient Re-evaluated:Patient Re-evaluated prior to induction Oxygen Delivery Method: Circle System Utilized Preoxygenation: Pre-oxygenation with 100% oxygen Induction Type: IV induction Ventilation: Oral airway inserted - appropriate to patient size and Two handed mask ventilation required Laryngoscope Size: Glidescope and 4 Grade View: Grade I Tube type: Oral Tube size: 7.5 mm Number of attempts: 1 Airway Equipment and Method: Stylet, Oral airway and Rigid stylet Placement Confirmation: ETT inserted through vocal cords under direct vision, positive ETCO2 and breath sounds checked- equal and bilateral Secured at: 24 cm Tube secured with: Tape Dental Injury: Teeth and Oropharynx as per pre-operative assessment  Comments: Performed by Justin Mend, SRNA under direct supervision of MDA and CRNA. Initial DL with Sabra Heck 3, unable to achieve a view. Glide used with T4 blade to achieve Grade 1 view.

## 2021-12-18 NOTE — Op Note (Signed)
NAME: JERRAD MENDIBLES    MRN: [id number] DOB: [birth year]    DATE OF OPERATION: 12/18/2021  PREOP DIAGNOSIS:    Symptomatic right-sided ICA stenosis  POSTOP DIAGNOSIS:    Same  PROCEDURE:    Right-sided transcarotid artery revascularization-10 x 40 mm stent  SURGEON: Broadus John  ASSIST: Roxy Horseman, PA  ANESTHESIA: General  EBL: 25 mL  INDICATIONS:    GLEASON ARDOIN is a 86 y.o. male with history of right hemispheric stroke, left upper extremity deficits, which have resolved.  Tian CTA demonstrated greater than 80% stenosis of the right internal carotid artery.  At his age, Brentlee was offered both carotid arterectomy and transcarotid artery revascularization.  After discussing the risk and benefits, Salim elected to proceed with TCAR.  FINDINGS:   6 mm x 30 mm balloon angioplasty 10 mm x 40 mm bare-metal stent  TECHNIQUE:   The patient was brought to the operating room, where support lines were placed and general anesthesia was secured. The right neck and left groin were prepped and the patient was sterilely draped. A transverse 2-4 cm incision was made between the sternal and clavicular heads of the sternocleidomastoid muscle, below the omohyoid. Following longitudinal division of the carotid sheath the jugular vein was partially dissected and retracted medially. Once 3 cm of common carotid artery (CCA) were isolated, umbilical tape was placed around the proximal 1/3 of the CCA under direct vision. A 5.0 polypropylene suture was pre-placed in the anterior wall of the CCA, in a "U stitch" configuration, close to the clavicle to facilitate hemostasis upon removal of the arterial sheath at completion of the TCAR procedure.  The contralateral (left) common femoral vein (CFV) was accessed under ultrasound guidance, using standard Seldinger and micropuncture access technique. Permanent recorded image(s) was/were saved in the patient's medical record. The Venous Return Sheath  was advanced into the CFV over the 0.035" wire provided. Blood was aspirated from the flow line followed by flushing of the Venous Sheath with heparinized saline. The Venous Sheath was secured to the patient's skin with suture to maintain optimal position in the vessel.  Heparin was given to obtain a therapeutic activated clotting time >250 seconds prior to arterial access. A 4-French non-stiffened ENHANCE Transcarotid / Peripheral Access set was used, puncturing the artery with the 21G needle through the pre-placed "U" stitch while holding gentle traction on the umbilical tape to stabilize and centralize the CCA within the incision. Careful attention was paid to the change in CCA shape when using the umbilical tape to control or lift the artery. The micropuncture wire was then advanced 3-4 cm into the CCA and, the 21G needle was removed. The micropuncture sheath was advanced 2-3 cm into the CCA and the wire and dilator were removed. Pulsatile backflow indicated correct positioning. The provided 0.035" J-tipped guidewire was inserted as close as possible to the bifurcation without engaging the lesion. After micropuncture sheath removal, the Transcarotid Arterial Sheath was advanced to the 2.5cm marker and the 0.035" wire and dilator were then removed. Arterial Sheath position was assessed under fluoroscopy in two projections to ensure that the sheath tip was oriented coaxially in the CCA. The Arterial Sheath was sutured to the patient with gentle forward tension. Blood was slowly aspirated followed by flushing with heparinized saline. No ingress of air bubbles through the passive hemostatic valve was observed. The stopcocks were closed. Traction applied to the CCA previously to facilitate access was gently released.  The Flow Controller  was connected to the Transcarotid Arterial Sheath, prepared by passively allowing a column of arterial blood to fill the line and connected to the Venous Return Sheath. CCA  inflow was occluded proximal to the arteriotomy with a vascular clamp to achieve active flow reversal. To confirm flow reversal, a saline bolus was delivered into the venous flow line on both "High" and "Low" flow settings of the Flow Controller. Angiograms were performed with slow injections of a small amount of contrast filling just past the lesion to minimize antegrade transmission of micro-bubbles.  Prior to lesion manipulation, heart rate (66QHU) and systolic BP (765-465KPTW) were managed upwards to optimize flow reversal and procedural neuroprotection. The lesion was crossed with an 0.014" ENROUTE guidewire and pre-dilation of the lesion was performed with a 55m x 321mrapid exchange 0.014" compatible balloon catheter to 8 atmospheres for 10 seconds. Stenting was performed with an 1058m [id number] Transcarotid stent, sized appropriately to the right CCA. AP and lateral angiograms (gentle contrast injections) were performed to confirm stent placement and arterial wall stent apposition.  There is residual stenosis appreciated due to the dense calcific disease.  The 6 mm x 30 mm angioplasty balloon was reinserted and inflated at the site of stenosis with significant improvement.  At case completion, there was less than 30% stenosis when compared to the distal internal carotid artery, above the level of poststenotic dilatation.    [facility], antegrade flow was restored by releasing the clamp on the CCA then closing the NPS stopcocks to the flow lines. The Transcarotid Arterial Sheath was removed and the pre-closure suture was tied. Heparin reversal was employed.  The Venous Return Sheath was removed and hemostasis was achieved with brief manual compression.  The access site was closed using 3-0 Vicryl suture, Monocryl, Dermabond at the skin.  The patient tolerated the procedure well and was extubated on the table. The patient was moving all four extremities to command prior to transfer  to the recovery room.  Given the complexity of the case a first assistant was necessary in order to expedient the procedure and safely perform the technical aspects of the operation.  JoshMacie Burows [facility] DICTATION:   12/18/2021

## 2021-12-18 NOTE — Progress Notes (Signed)
[facility] [place]  Subjective  - Eating sitting up in bed   Objective 136/70 70 97.6 F (36.4 C) (Oral) 17 95%  Intake/Output Summary (Last 24 hours) at 12/18/2021 1503 Last data filed at 12/18/2021 1419 Gross per 24 hour  Intake 2700 ml  Output 875 ml  Net 1825 ml    Neck incision healing well without hematoma Moving all extremities No tongue deviation or facial droop. No dysphasia with oral intake Lungs non labored breathing  Assessment/Planning: S/p TCAR symptomatic  stroke with left UE deficit  No neurologic deficits Hypotensive post op in PACU requiring Dopamine @ 3 mcg will plan on weaning off. BP stable 034'V systolic.  Heart rate episodic Afib now in NS 70 bpm.  Will observe.    Roxy Horseman 12/18/2021 3:03 PM --  Laboratory Lab Results: Recent Labs    12/17/21 1443  WBC 7.6  HGB 15.4  HCT 45.7  PLT 228   BMET Recent Labs    12/17/21 1443  NA 141  K 3.8  CL 105  CO2 29  GLUCOSE 96  BUN 12  CREATININE 0.98  CALCIUM 9.6    COAG Lab Results  Component Value Date   INR 1.1 12/17/2021   INR 1.0 12/09/2021   No results found for: PTT

## 2021-12-18 NOTE — Progress Notes (Signed)
12/18/2021 1300 Received pt to room 4E-27 from PACU, pt is S/P TCAR.  Pt is A&O, no C/O voiced.  Neuro intact. Tele monitor applied and CCMD notified.  CHG bath given.  Oriented to room, call light and bed.  Call bell in reach, family at bedside.   Carney Corners

## 2021-12-18 NOTE — Transfer of Care (Signed)
Immediate Anesthesia Transfer of Care Note  Patient: Nathaniel Mcclain  Procedure(s) Performed: Right Transcarotid Artery Revascularization (Right)  Patient Location: PACU  Anesthesia Type:General  Level of Consciousness: drowsy  Airway & Oxygen Therapy: Patient Spontanous Breathing and Patient connected to face mask oxygen  Post-op Assessment: Report given to RN, Post -op Vital signs reviewed and stable and Patient moving all extremities X 4  Post vital signs: Reviewed and stable  Last Vitals:  Vitals Value Taken Time  BP 96/56 12/18/21 1056  Temp 36.4 C 12/18/21 0940  Pulse 54 12/18/21 1055  Resp 13 12/18/21 1055  SpO2 96 % 12/18/21 1055  Vitals shown include unvalidated device data.  Last Pain:  Vitals:   12/18/21 1010  TempSrc:   PainSc: 0-No pain         Complications: No notable events documented.

## 2021-12-18 NOTE — H&P (Signed)
Hospital Consult  Patient seen and examined in preop holding.  No complaints. No changes to medication history or physical exam since last seen. Left-sided deficits improved.  After discussing the risks and benefits of right transcarotid artery stenting, Nathaniel Mcclain elected to proceed.   Nathaniel John MD   Reason for Consult: CVA, carotid artery stenosis Requesting Physician: Dr. Candiss Norse  MRN #:  [id number]  History of Present Illness: This is a 86 y.o. male with past medical history significant for hypertension, hyperlipidemia, history of CVA, and diabetes mellitus with recent hemoglobin A1c of 6.  He presented to the emergency department with left hand weakness over the past several days.  Work-up included MRI brain demonstrating right-sided CVA.  He also underwent CTA head and neck demonstrating 80% stenosis of the right ICA.  Patient denies any further strokelike symptoms including slurring speech or changes in vision.  He also states his left hand weakness is nearly resolved.  He is on aspirin daily.  He was started on Plavix.  He also takes a statin daily.  He denies tobacco use.  Past Medical History:  Diagnosis Date   Arthritis    Colon polyps    Diabetes ([place])    pt denies, and does not take any medications   GERD (gastroesophageal reflux disease)    Hepatitis    hx of Hepatitis A   Hyperlipidemia    Hypertension    Hypothyroid    Osteoarthritis    TIA (transient ischemic attack) 1980    Past Surgical History:  Procedure Laterality Date   THYROIDECTOMY  2008    No Known Allergies  Prior to Admission medications   Medication Sig Start Date End Date Taking? Authorizing Provider  amLODipine (NORVASC) 10 MG tablet Take 10 mg by mouth daily. 10/13/21  Yes [provider]  aspirin 325 MG EC tablet Take 325 mg by mouth daily.   Yes [provider]  benazepril (LOTENSIN) 20 MG tablet Take 10 mg by mouth 2 (two) times daily. 11/10/21  Yes [provider]  bisacodyl (DULCOLAX) 5 MG EC tablet Take 10 mg by mouth every other day.   Yes [provider]  esomeprazole (NEXIUM) 40 MG capsule Take 40 mg by mouth daily at 12 noon.   Yes [provider]  levothyroxine (SYNTHROID) 150 MCG tablet Take 150 mcg by mouth every morning. 12/08/21  Yes [provider]  oxybutynin (DITROPAN-XL) 5 MG 24 hr tablet Take 5 mg by mouth daily. 12/08/21  Yes [provider]  pantoprazole (PROTONIX) 40 MG tablet Take 40 mg by mouth daily. 12/08/21  Yes [provider]  pravastatin (PRAVACHOL) 80 MG tablet Take 80 mg by mouth daily.   Yes [provider]  valACYclovir (VALTREX) 1000 MG tablet Take 1,000 mg by mouth daily as needed (For cold sores). 10/01/21  Yes [provider]  COVID-19 mRNA bivalent vaccine, Pfizer, (PFIZER COVID-19 VAC BIVALENT) injection Inject into the muscle. 05/06/21     COVID-19 mRNA Vac-TriS, Pfizer, (PFIZER-BIONT COVID-19 VAC-TRIS) SUSP injection Inject into the muscle. 01/15/21   Carlyle Basques, MD  influenza vaccine adjuvanted (FLUAD QUADRIVALENT) 0.5 ML injection Inject into the muscle. 05/06/21       Social History   Socioeconomic History   Marital status: Married    Spouse name: Not on file   Number of children: 1   Years of education: Not on file   Highest education level: Not on file  Occupational History   Not  on file  Tobacco Use   Smoking status: Never   Smokeless tobacco: Never  Vaping Use   Vaping Use: Never used  Substance and Sexual Activity   Alcohol use: No    Alcohol/week: 0.0 standard drinks   Drug use: No   Sexual activity: Not on file  Other Topics Concern   Not on file  Social History Narrative   Not on file   Social Determinants of Health   Financial Resource Strain: Not on file  Food Insecurity: Not on file  Transportation Needs: Not on file  Physical Activity: Not on file  Stress: Not on file  Social Connections: Not on file   Intimate Partner Violence: Not on file     Family History  Problem Relation Age of Onset   Diabetes Mother    Heart disease Mother    Colon cancer Neg Hx     ROS: Otherwise negative unless mentioned in HPI  Physical Examination  Vitals:   12/18/21 0549  BP: (!) 167/88  Pulse: 61  Resp: 17  Temp: 97.9 F (36.6 C)  SpO2: 97%   Body mass index is 25.77 kg/m.  General:  WDWN in NAD Gait: Not observed HENT: WNL, normocephalic Pulmonary: normal non-labored breathing, without Rales, rhonchi,  wheezing Cardiac: regular Abdomen:  soft, NT/ND, no masses Skin: without rashes Vascular Exam/Pulses: Symmetrical radial pulses; symmetrical PT pulses Extremities: without ischemic changes, without Gangrene , without cellulitis; without open wounds;  Musculoskeletal: no muscle wasting or atrophy  Neurologic: A&O X 3; mild left hand grip strength deficit compared to right Psychiatric:  The pt has Normal affect. Lymph:  Unremarkable  CBC    Component Value Date/Time   WBC 7.6 12/17/2021 1443   RBC 4.92 12/17/2021 1443   HGB 15.4 12/17/2021 1443   HCT 45.7 12/17/2021 1443   PLT 228 12/17/2021 1443   MCV 92.9 12/17/2021 1443   MCH 31.3 12/17/2021 1443   MCHC 33.7 12/17/2021 1443   RDW 13.7 12/17/2021 1443   LYMPHSABS 2.2 12/11/2021 0256   MONOABS 0.5 12/11/2021 0256   EOSABS 0.2 12/11/2021 0256   BASOSABS 0.1 12/11/2021 0256    BMET    Component Value Date/Time   NA 141 12/17/2021 1443   K 3.8 12/17/2021 1443   CL 105 12/17/2021 1443   CO2 29 12/17/2021 1443   GLUCOSE 96 12/17/2021 1443   BUN 12 12/17/2021 1443   CREATININE 0.98 12/17/2021 1443   CALCIUM 9.6 12/17/2021 1443   GFRNONAA >60 12/17/2021 1443    COAGS: Lab Results  Component Value Date   INR 1.1 12/17/2021   INR 1.0 12/09/2021     Non-Invasive Vascular Imaging:   MRI brain significant for right-sided CVA  CTA neck demonstrating 80% stenosis of the right ICA and 50% stenosis of the left  ICA     ASSESSMENT/PLAN: This is a 86 y.o. male with symptomatic right carotid artery stenosis, CVA  -Subjectively, patient believes his left hand weakness has nearly returned to baseline.  He denies any further strokelike symptoms. -Patient experienced a right-sided CVA on MRI.  CTA neck demonstrates 80% stenosis of the right ICA which is the likely etiology. -Patient will be considered for right carotid artery endarterectomy versus TCAR within the next 2 weeks.  On-call vascular surgeon Dr. Virl Cagey will evaluate the patient later today and provide further treatment plans.   Dagoberto Ligas PA-C [facility] [phone]   VASCULAR STAFF ADDENDUM: I have independently interviewed and examined the patient.  I agree with the above.  In brief, patient with symptomatic right-sided ICA stenosis.  Left-sided deficits-mainly weakness in the left upper extremity, have pretty much resolved Patient on dual antiplatelet therapy, high intensity statin I had a long conversation with Shanon Brow regarding cerebral revascularization in the setting of recent stroke.  Shanon Brow has roughly a 25% chance of having another stroke in the next 2 years.  With his current health, I have offered transcarotid artery revascularization in an effort to decrease his stroke risk.  After discussing the risk and benefits of surgery, Shanon Brow elected to proceed. He is scheduled for next Thursday.  He can present as an outpatient.  Please continue dual antiplatelet therapy, high intensity statin   Cassandria Santee, MD [facility] Phone Number: [phone] 12/18/2021 7:10 AM

## 2021-12-19 ENCOUNTER — Encounter (HOSPITAL_COMMUNITY): Payer: Self-pay | Admitting: Vascular Surgery

## 2021-12-19 LAB — BASIC METABOLIC PANEL
Anion gap: 8 (ref 5–15)
BUN: 17 mg/dL (ref 8–23)
CO2: 25 mmol/L (ref 22–32)
Calcium: 8.4 mg/dL — ABNORMAL LOW (ref 8.9–10.3)
Chloride: 107 mmol/L (ref 98–111)
Creatinine, Ser: 1.38 mg/dL — ABNORMAL HIGH (ref 0.61–1.24)
GFR, Estimated: 50 mL/min — ABNORMAL LOW (ref >60)
Glucose, Bld: 154 mg/dL — ABNORMAL HIGH (ref 70–99)
Potassium: 4.1 mmol/L (ref 3.5–5.1)
Sodium: 140 mmol/L (ref 135–145)

## 2021-12-19 LAB — CBC
HCT: 38.4 % — ABNORMAL LOW (ref 39.0–52.0)
Hemoglobin: 13.1 g/dL (ref 13.0–17.0)
MCH: 31.6 pg (ref 26.0–34.0)
MCHC: 34.1 g/dL (ref 30.0–36.0)
MCV: 92.5 fL (ref 80.0–100.0)
Platelets: 188 10*3/uL (ref 150–400)
RBC: 4.15 MIL/uL — ABNORMAL LOW (ref 4.22–5.81)
RDW: 13.6 % (ref 11.5–15.5)
WBC: 11.2 10*3/uL — ABNORMAL HIGH (ref 4.0–10.5)
nRBC: 0 % (ref 0.0–0.2)

## 2021-12-19 LAB — LIPID PANEL
Cholesterol: 74 mg/dL (ref 0–200)
HDL: 37 mg/dL — ABNORMAL LOW (ref >40)
LDL Cholesterol: 19 mg/dL (ref 0–99)
Total CHOL/HDL Ratio: 2 RATIO
Triglycerides: 89 mg/dL (ref <150)
VLDL: 18 mg/dL (ref 0–40)

## 2021-12-19 MED ORDER — PSEUDOEPHEDRINE HCL 30 MG PO TABS
30.0000 mg | ORAL_TABLET | Freq: Four times a day (QID) | ORAL | Status: DC
  Administered 2021-12-19 – 2021-12-20 (×4): 30 mg via ORAL
  Filled 2021-12-19 (×7): qty 1

## 2021-12-19 NOTE — Progress Notes (Addendum)
  Progress Note    12/19/2021 7:48 AM 1 Day Post-Op  Subjective:  no complaints   Vitals:   12/19/21 0024 12/19/21 0424  BP: 119/69 (!) 109/56  Pulse: (!) 59 (!) 46  Resp: 17 14  Temp: 98 F (36.7 C) 98 F (36.7 C)  SpO2: 92% 91%   Physical Exam: Cardiac: irregular Lungs:  non labored Incisions:  right neck incision c/d/I. No swelling or hematoma Extremities:  moving all extremities without deficits Neurologic: alert and oriented. Smile symmetric. Speech coherent. Sensation intact and equal bilaterally  CBC    Component Value Date/Time   WBC 11.2 (H) 12/19/2021 0134   RBC 4.15 (L) 12/19/2021 0134   HGB 13.1 12/19/2021 0134   HCT 38.4 (L) 12/19/2021 0134   PLT 188 12/19/2021 0134   MCV 92.5 12/19/2021 0134   MCH 31.6 12/19/2021 0134   MCHC 34.1 12/19/2021 0134   RDW 13.6 12/19/2021 0134   LYMPHSABS 2.2 12/11/2021 0256   MONOABS 0.5 12/11/2021 0256   EOSABS 0.2 12/11/2021 0256   BASOSABS 0.1 12/11/2021 0256    BMET    Component Value Date/Time   NA 140 12/19/2021 0134   K 4.1 12/19/2021 0134   CL 107 12/19/2021 0134   CO2 25 12/19/2021 0134   GLUCOSE 154 (H) 12/19/2021 0134   BUN 17 12/19/2021 0134   CREATININE 1.38 (H) 12/19/2021 0134   CALCIUM 8.4 (L) 12/19/2021 0134   GFRNONAA 50 (L) 12/19/2021 0134    INR    Component Value Date/Time   INR 1.1 12/17/2021 1443     Intake/Output Summary (Last 24 hours) at 12/19/2021 0748 Last data filed at 12/18/2021 2334 Gross per 24 hour  Intake 2940 ml  Output 875 ml  Net 2065 ml     Assessment/Plan:  86 y.o. male is s/p R TCAR 1 Day Post-Op   Right neck incision c/d/I without swelling or hematoma BP remains soft. Will have pt hold antihypertensive medications until SBP > 120 Continue Aspirin, Statin, Plavix Possible discharge today if tolerates ambulation and after PT/OT eval PT/ OT to eval this morning Will arrange follow up in 1 month with carotid duplex   Karoline Caldwell, PA-C [facility] [phone] 12/19/2021 7:48 AM  VASCULAR STAFF ADDENDUM: I have independently interviewed and examined the patient. I agree with the above.  Sensorimotor intact, doing well this morning, dopamine turned off We will ensure normotension this morning, A-line out Discharge at lunch as long as patient has normotension and passes physical therapy.  Cassandria Santee, MD [facility] Phone Number: [phone] 12/19/2021 8:13 AM

## 2021-12-19 NOTE — Evaluation (Signed)
Physical Therapy Evaluation Patient Details Name: Nathaniel Mcclain MRN: [id number] DOB: 06-07-1936 Today's Date: 12/19/2021  History of Present Illness  Pt is an 86 y/o male s/p R TCAR on 5/18. PMH includes recent CVA, HTN, DM, and hep A.  Clinical Impression  Pt admitted secondary to problem above with deficits below. Pt requiring min A for bed mobility and transfers. Further mobility limited by hypotension. BP in supine at 83/53, sitting 80/52, and standing 76/44. Return to supine was 81/53. RN present throughout. Anticipate pt will progress well once BP improves. Will continue to follow acutely.        Recommendations for follow up therapy are one component of a multi-disciplinary discharge planning process, led by the attending physician.  Recommendations may be updated based on patient status, additional functional criteria and insurance authorization.  Follow Up Recommendations Home health PT (pending progression)    Assistance Recommended at Discharge Intermittent Supervision/Assistance  Patient can return home with the following       Equipment Recommendations None recommended by PT  Recommendations for Other Services       Functional Status Assessment Patient has had a recent decline in their functional status and demonstrates the ability to make significant improvements in function in a reasonable and predictable amount of time.     Precautions / Restrictions Precautions Precautions: Fall;Other (comment) Precaution Comments: watch BP Restrictions Weight Bearing Restrictions: No      Mobility  Bed Mobility Overal bed mobility: Needs Assistance Bed Mobility: Supine to Sit, Sit to Supine     Supine to sit: Min assist Sit to supine: Modified independent (Device/Increase time)   General bed mobility comments: Min A For trunk elevation to come to sitting    Transfers Overall transfer level: Needs assistance Equipment used: None Transfers: Sit to/from Stand Sit to  Stand: Min assist           General transfer comment: Min A For lift assist and steadying. Pt with lower BP in standing (76/44) so further mobility deferred.    Ambulation/Gait                  Stairs            Wheelchair Mobility    Modified Rankin (Stroke Patients Only)       Balance Overall balance assessment: Needs assistance Sitting-balance support: No upper extremity supported, Feet supported Sitting balance-Leahy Scale: Good     Standing balance support: No upper extremity supported Standing balance-Leahy Scale: Fair                               Pertinent Vitals/Pain Pain Assessment Pain Assessment: No/denies pain    Home Living Family/patient expects to be discharged to:: Private residence Living Arrangements: Spouse/significant other Available Help at Discharge: Family Type of Home: House Home Access: Stairs to enter Entrance Stairs-Rails: Right Entrance Stairs-Number of Steps: 3 steps front, 4 in back   Home Layout: One level Home Equipment: Conservation officer, nature (2 wheels);Wheelchair - manual;Shower seat;BSC/3in1      Prior Function Prior Level of Function : Independent/Modified Independent                     Journalist, newspaper        Extremity/Trunk Assessment   Upper Extremity Assessment Upper Extremity Assessment: Defer to OT evaluation    Lower Extremity Assessment Lower Extremity Assessment: Generalized weakness    Cervical / Trunk  Assessment Cervical / Trunk Assessment: Normal  Communication   Communication: No difficulties  Cognition Arousal/Alertness: Awake/alert Behavior During Therapy: WFL for tasks assessed/performed Overall Cognitive Status: No family/caregiver present to determine baseline cognitive functioning                                          General Comments General comments (skin integrity, edema, etc.): BP in supine at 83/53, sitting 80/52, and standing 76/44.  Return to supine was 81/53.    Exercises     Assessment/Plan    PT Assessment Patient needs continued PT services  PT Problem List Decreased strength;Decreased balance;Decreased activity tolerance;Decreased mobility;Decreased knowledge of use of DME;Decreased knowledge of precautions       PT Treatment Interventions DME instruction;Gait training;Stair training;Functional mobility training;Therapeutic activities;Balance training;Therapeutic exercise;Patient/family education    PT Goals (Current goals can be found in the Care Plan section)  Acute Rehab PT Goals Patient Stated Goal: to go home PT Goal Formulation: With patient Time For Goal Achievement: 01/02/22 Potential to Achieve Goals: Good    Frequency Min 3X/week     Co-evaluation               AM-PAC PT "6 Clicks" Mobility  Outcome Measure Help needed turning from your back to your side while in a flat bed without using bedrails?: None Help needed moving from lying on your back to sitting on the side of a flat bed without using bedrails?: A Little Help needed moving to and from a bed to a chair (including a wheelchair)?: A Little Help needed standing up from a chair using your arms (e.g., wheelchair or bedside chair)?: A Little Help needed to walk in hospital room?: A Little Help needed climbing 3-5 steps with a railing? : A Little 6 Click Score: 19    End of Session Equipment Utilized During Treatment: Gait belt Activity Tolerance: Treatment limited secondary to medical complications (Comment) (low BP) Patient left: in bed;with call bell/phone within reach;with nursing/sitter in room Nurse Communication: Mobility status;Other (comment) (low BP) PT Visit Diagnosis: Unsteadiness on feet (R26.81);Muscle weakness (generalized) (M62.81)    Time: 3244-0102 PT Time Calculation (min) (ACUTE ONLY): 15 min   Charges:   PT Evaluation $PT Eval Moderate Complexity: 1 Mod          Reuel Derby, PT, DPT  Acute  Rehabilitation Services  Pager: [phone] Office: [phone]   Rudean Hitt 12/19/2021, 9:27 AM

## 2021-12-19 NOTE — Progress Notes (Signed)
PHARMACIST LIPID MONITORING   Nathaniel Mcclain is a 86 y.o. male admitted on 12/18/2021 presenting with hand weakness, CTA with ICA stenosis.  Pharmacy has been consulted to optimize lipid-lowering therapy with the indication of secondary prevention for clinical ASCVD.  Recent Labs:  Lipid Panel (last 6 months):   Lab Results  Component Value Date   CHOL 74 12/19/2021   TRIG 89 12/19/2021   HDL 37 (L) 12/19/2021   CHOLHDL 2.0 12/19/2021   VLDL 18 12/19/2021   LDLCALC 19 12/19/2021    Hepatic function panel (last 6 months):   Lab Results  Component Value Date   AST 22 12/17/2021   ALT 25 12/17/2021   ALKPHOS 51 12/17/2021   BILITOT 0.5 12/17/2021    SCr (since admission):   Serum creatinine: 1.38 mg/dL (H) 12/19/21 0134 Estimated creatinine clearance: 43.7 mL/min (A)  Current therapy and lipid therapy tolerance Current lipid-lowering therapy: Crestor '40mg'$  and zetia '10mg'$  Previous lipid-lowering therapies (if applicable): n/a Documented or reported allergies or intolerances to lipid-lowering therapies (if applicable): n/a  Assessment:   LDL controlled on high intensity statin and zetia PTA  Plan:    1.Statin intensity (high intensity recommended for all patients regardless of the LDL):  No statin changes. The patient is already on a high intensity statin.  2.Add ezetimibe (if any one of the following):   Already on PTA  3.Refer to lipid clinic:   No  4.Follow-up with:  Primary care provider - Burnard Bunting, MD  5.Follow-up labs after discharge:  No changes in lipid therapy, repeat a lipid panel in one year.       Bertis Ruddy, PharmD 12/19/2021, 7:53 AM

## 2021-12-19 NOTE — Progress Notes (Signed)
OT Cancellation Note  Patient Details Name: Nathaniel Mcclain MRN: [id number] DOB: 03-23-1936   Cancelled Treatment:    Reason Eval/Treat Not Completed: Patient not medically ready (hypotensive with PT this am. Will attempt to see later this afternoon as BP improves.)  Ashyla Luth,HILLARY 12/19/2021, 9:43 AM Maurie Boettcher, OT/L   Acute OT Clinical Specialist Acute Rehabilitation Services Pager [phone] Office [phone]

## 2021-12-19 NOTE — Progress Notes (Signed)
  Progress Note    12/19/2021 4:03 PM 1 Day Post-Op  Pt remains hypotensive. Asymptomatic. Continue to hold home BP meds. Discussed with patient recommendation to keep him another night. Explained that this is very common after TCAR. Giving Pseudoephedrine 30 mg q 6hr. If remains SBP < 100 may need to restart dopamine. Will continue to monitor for now  Karoline Caldwell, [place] [facility] [phone] 12/19/2021 4:03 PM

## 2021-12-19 NOTE — TOC Initial Note (Signed)
Transition of Care (TOC) - Initial/Assessment Note  Marvetta Gibbons RN, BSN Transitions of Care Unit 4E- RN Case Manager See Treatment Team for direct phone #    Patient Details  Name: Nathaniel Mcclain MRN: [id number] Date of Birth: Apr 06, 1936  Transition [facility]) CM/SW Contact:    Dawayne Patricia, RN Phone Number: 12/19/2021, 3:19 PM  Clinical Narrative:                 Noted order for HHPT, CM in to speak with pt at bedside, son also present.  Discussed recommendation for [facility] therapy. Per pt he does not think he will need it, discussed option for referral and then pt can decline when they call to set up visit. Pt remains unsure if he wants HH, stating that he feels when he can get up and moving he will be fine. Pt and son voiced they will think about it, list provided for choice Per CMS guidelines from medicare.gov website with star ratings (copy placed in shadow chart), pt agreeable for TOC to follow up with him prior to discharge to see if he would like referral.  Per pt he has all needed DME at home, son to transport home.   Expected Discharge Plan: [place] Barriers to Discharge: Continued Medical Work up   Patient Goals and CMS Choice Patient states their goals for this hospitalization and ongoing recovery are:: return home CMS Medicare.gov Compare Post Acute Care list provided to:: Patient Choice offered to / list presented to : Patient, Adult Children  Expected Discharge Plan and Services Expected Discharge Plan: [place]   Discharge Planning Services: CM Consult Post Acute Care Choice: [place] arrangements for the past 2 months: Single Family Home                           HH Arranged: PT          Prior Living Arrangements/Services Living arrangements for the past 2 months: Single Family Home Lives with:: Spouse Patient language and need for interpreter reviewed:: Yes Do you feel safe going back to the  place where you live?: Yes      Need for Family Participation in Patient Care: Yes (Comment) Care giver support system in place?: Yes (comment) Current home services: DME (RW, BSC) Criminal Activity/Legal Involvement Pertinent to Current Situation/Hospitalization: No - Comment as needed  Activities of Daily Living      Permission Sought/Granted                  Emotional Assessment Appearance:: Appears stated age Attitude/Demeanor/Rapport: Engaged Affect (typically observed): Appropriate, Pleasant Orientation: : Oriented to Self, Oriented to Place, Oriented to  Time, Oriented to Situation Alcohol / Substance Use: Not Applicable Psych Involvement: No (comment)  Admission diagnosis:  Carotid stenosis [I65.29] Carotid stenosis, symptomatic w/o infarct [I65.29] Patient Active Problem List   Diagnosis Date Noted   Carotid stenosis 12/18/2021   Carotid stenosis, symptomatic w/o infarct 12/18/2021   Acute CVA (cerebrovascular accident) ([place]) 12/09/2021   Hypothyroidism 12/09/2021   Essential hypertension 12/09/2021   Hyperlipidemia 12/09/2021   GERD (gastroesophageal reflux disease) 12/09/2021   PCP:  Burnard Bunting, MD Pharmacy:   [facility] [place] [place] [address] Phone: [phone] Fax: [phone]  [facility] [address]  [place] [address] Phone: [phone] Fax: [phone]     Social Determinants of Health (SDOH) Interventions    Readmission Risk Interventions     View : No data to display.

## 2021-12-19 NOTE — Discharge Instructions (Signed)
   [facility] [place]  Discharge Instructions   Carotid Surgery  Please refer to the following instructions for your post-procedure care. Your surgeon or physician assistant will discuss any changes with you.  Activity  You are encouraged to walk as much as you can. You can slowly return to normal activities but must avoid strenuous activity and heavy lifting until your doctor tell you it's okay. Avoid activities such as vacuuming or swinging a golf club. You can drive after one week if you are comfortable and you are no longer taking prescription pain medications. It is normal to feel tired for serval weeks after your surgery. It is also normal to have difficulty with sleep habits, eating, and bowel movements after surgery. These will go away with time.  Bathing/Showering  Shower daily after you go home. Do not soak in a bathtub, hot tub, or swim until the incision heals completely.  Incision Care  Shower every day. Clean your incision with mild soap and water. Pat the area dry with a clean towel. You do not need a bandage unless otherwise instructed. Do not apply any ointments or creams to your incision. You may have skin glue on your incision. Do not peel it off. It will come off on its own in about one week. Your incision may feel thickened and raised for several weeks after your surgery. This is normal and the skin will soften over time.   For Men Only: It's okay to shave around the incision but do not shave the incision itself for 2 weeks. It is common to have numbness under your chin that could last for several months.  Diet  Resume your normal diet. There are no special food restrictions following this procedure. A low fat/low cholesterol diet is recommended for all patients with vascular disease. In order to heal from your surgery, it is CRITICAL to get adequate nutrition. Your body requires vitamins, minerals, and protein. Vegetables are the best source of  vitamins and minerals. Vegetables also provide the perfect balance of protein. Processed food has little nutritional value, so try to avoid this.  Medications  Resume taking all of your medications unless your doctor or physician assistant tells you not to. If your incision is causing pain, you may take over-the- counter pain relievers such as acetaminophen (Tylenol). If you were prescribed a stronger pain medication, please be aware these medications can cause nausea and constipation. Prevent nausea by taking the medication with a snack or meal. Avoid constipation by drinking plenty of fluids and eating foods with a high amount of fiber, such as fruits, vegetables, and grains.   Do not take Tylenol if you are taking prescription pain medications.  Follow Up  Our office will schedule a follow up appointment 2-3 weeks following discharge.  Please call us immediately for any of the following conditions  . Increased pain, redness, drainage (pus) from your incision site. . Fever of 101 degrees or higher. . If you should develop stroke (slurred speech, difficulty swallowing, weakness on one side of your body, loss of vision) you should call 911 and go to the nearest emergency room. .  Reduce your risk of vascular disease:  . Stop smoking. If you would like help call [facility] at 1-800-QUIT-NOW (1-800-784-8669) or [place] at 336-586-4000. . Manage your cholesterol . Maintain a desired weight . Control your diabetes . Keep your blood pressure down .  If you have any questions, please call the office at [phone]. 

## 2021-12-20 ENCOUNTER — Other Ambulatory Visit: Payer: Self-pay | Admitting: Vascular Surgery

## 2021-12-20 MED ORDER — ACETAMINOPHEN-CODEINE 300-15 MG PO TABS: 1.0000 | ORAL_TABLET | ORAL | 0 refills | Status: DC | PRN

## 2021-12-20 NOTE — TOC Transition Note (Signed)
Transition of Care [facility]) - CM/SW Discharge Note   Patient Details  Name: Nathaniel Mcclain MRN: [id number] Date of Birth: 04/09/1936  Transition [facility]) CM/SW Contact:  Gayla Medicus., RN Phone Number: 12/20/2021, 9:06 AM  Clinical Narrative:    86 y.o. male with past medical history significant for hypertension, hyperlipidemia, history of CVA, and diabetes mellitus with recent hemoglobin A1c of 6.  He presented to the emergency department with left hand weakness over the past several days.  Work-up included MRI brain demonstrating right-sided CVA.  He also underwent CTA head and neck demonstrating 80% stenosis of the right ICA.  Patient denies any further strokelike symptoms including slurring speech or changes in vision.  He also states his left hand weakness is nearly resolved.  He is on aspirin daily.  He was started on Plavix.  He also takes a statin daily.  He denies tobacco use.  RNCM followed up with patient regarding [place] services.  Patient declines [place] services at this time.    Barriers to Discharge: Continued Medical Work up  Patient Goals and CMS Choice Patient states their goals for this hospitalization and ongoing recovery are:: return home CMS Medicare.gov Compare Post Acute Care list provided to:: Patient Choice offered to / list presented to : Patient, Adult Children  Discharge Placement                       Discharge Plan and Services   Discharge Planning Services: CM Consult Post Acute Care Choice: Home Health                    HH Arranged: Refused HH          Social Determinants of Health (SDOH) Interventions    Readmission Risk Interventions     View : No data to display.

## 2021-12-20 NOTE — Progress Notes (Signed)
Physical Therapy Treatment Patient Details Name: Nathaniel Mcclain MRN: [id number] DOB: 1936-03-20 Today's Date: 12/20/2021   History of Present Illness Pt is an 86 y/o male s/p R TCAR on 5/18. PMH includes recent CVA, HTN, DM, and hep A.    PT Comments    Pt received in supine, agreeable to therapy session and with improved standing tolerance and activity tolerance, no c/o dizziness with longer household distance gait trial. Emphasis on safety with transfers, gait progression and use of AD when by himself for fall risk prevention, activity pacing and safe stair sequencing. Session time limited due to arrival of family to transport him home. Pt continues to benefit from PT services to progress toward functional mobility goals.    Recommendations for follow up therapy are one component of a multi-disciplinary discharge planning process, led by the attending physician.  Recommendations may be updated based on patient status, additional functional criteria and insurance authorization.  Follow Up Recommendations  Home health PT     Assistance Recommended at Discharge Intermittent Supervision/Assistance  Patient can return home with the following     Equipment Recommendations  None recommended by PT (he has RW and cane)    Recommendations for Other Services       Precautions / Restrictions Precautions Precautions: Fall;Other (comment) Restrictions Weight Bearing Restrictions: No     Mobility  Bed Mobility               General bed mobility comments: received in armless chair in room    Transfers Overall transfer level: Needs assistance Equipment used: None Transfers: Sit to/from Stand Sit to Stand: Supervision           General transfer comment: denies dizziness or fatigue    Ambulation/Gait Ambulation/Gait assistance: Supervision Gait Distance (Feet): 180 Feet Assistive device: None Gait Pattern/deviations: Decreased stride length, Shuffle, Decreased dorsiflexion -  right, Decreased dorsiflexion - left (decreased B arm swing, small shuffled nearly festinating pattern)       General Gait Details: no dizziness reported   Stairs Stairs: Yes Stairs assistance: Min guard Stair Management: One rail Left, Step to pattern, Forwards Number of Stairs: 4 General stair comments: cues for step-to pattern and safety, pt initially reports no pain but post-ambulation, pt states "my R hip is sore" so discussed safe sequencing pattern for stairs with this in mind.   Wheelchair Mobility    Modified Rankin (Stroke Patients Only)       Balance Overall balance assessment: Needs assistance Sitting-balance support: No upper extremity supported, Feet supported Sitting balance-Leahy Scale: Good     Standing balance support: No upper extremity supported Standing balance-Leahy Scale: Fair Standing balance comment: pt performed head turns, fast/slow speed changes, 180 deg turns, directional changes without LOB no AD                            Cognition Arousal/Alertness: Awake/alert Behavior During Therapy: WFL for tasks assessed/performed Overall Cognitive Status: No family/caregiver present to determine baseline cognitive functioning               Exercises      General Comments General comments (skin integrity, edema, etc.): BP 131/69 supine earlier in day and pt denies lightheadedness or fatigue during transfers/gait      Pertinent Vitals/Pain Pain Assessment Pain Assessment: No/denies pain           PT Goals (current goals can now be found in the care plan section)  Acute Rehab PT Goals Patient Stated Goal: to go home PT Goal Formulation: With patient Time For Goal Achievement: 01/02/22 Progress towards PT goals: Progressing toward goals    Frequency    Min 3X/week      PT Plan Current plan remains appropriate       AM-PAC PT "6 Clicks" Mobility   Outcome Measure  Help needed turning from your back to your side  while in a flat bed without using bedrails?: None Help needed moving from lying on your back to sitting on the side of a flat bed without using bedrails?: A Little Help needed moving to and from a bed to a chair (including a wheelchair)?: A Little Help needed standing up from a chair using your arms (e.g., wheelchair or bedside chair)?: A Little Help needed to walk in hospital room?: A Little Help needed climbing 3-5 steps with a railing? : A Little 6 Click Score: 19    End of Session Equipment Utilized During Treatment: Gait belt Activity Tolerance: Patient tolerated treatment well (time limited due to imminent DC, his ride arrived to room at end of session) Patient left: with call bell/phone within reach;in chair;with nursing/sitter in room;with family/visitor present Nurse Communication: Mobility status PT Visit Diagnosis: Unsteadiness on feet (R26.81);Muscle weakness (generalized) (M62.81)     Time: 1050-1100 PT Time Calculation (min) (ACUTE ONLY): 10 min  Charges:  $Gait Training: 8-22 mins                     Nathaniel Mcclain P., PTA Acute Rehabilitation Services Secure Chat Preferred 9a-5:30pm Office: [place] 12/20/2021, 11:40 AM

## 2021-12-20 NOTE — Progress Notes (Signed)
Pt ambulated around unit x 300 feet with front wheel walker, pt tolerated well

## 2021-12-20 NOTE — Discharge Summary (Addendum)
Physician Discharge Summary  Patient ID: Nathaniel Mcclain MRN: [id number] DOB/AGE: [birth year] 84 y.o.  Admit date: 12/18/2021 Discharge date: 12/20/2021  Admission Diagnoses: Symptomatic carotid artery stenosis  Discharge Diagnoses:  Principal Problem:   Carotid stenosis Active Problems:   Carotid stenosis, symptomatic w/o infarct   Discharged Condition: good  Hospital Course: Patient underwent right TCAR on 12/18/2021.  He had postoperative hypotension which was asymptomatic.  This was observed on 12/19/2021.  He was given pseudoephedrine with good effect on blood pressure.  On 12/20/2021 he was found to be in a good state of health, with no evidence of cranial nerve injury, normal neurologic exam, soft neck.  He was planned for discharge after ambulating and tolerating breakfast.  Consults: None  Significant Diagnostic Studies: None  Treatments: Right TCAR 12/18/2021  Discharge Exam: Blood pressure 132/72, pulse (!) 49, temperature 98.4 F (36.9 C), temperature source Oral, resp. rate 18, height 6' (1.829 m), weight 86.2 kg, SpO2 92 %. No acute distress Regular rate and rhythm Unlabored breathing Right neck soft without hematoma Cranial nerves intact 5 out of 5 strength throughout.  Disposition:  Discharge disposition: 01-Home or Self Care       Discharge Instructions     CAROTID [place]: Call MD for difficulty swallowing or speaking; weakness in arms or legs that is a new symtom; severe headache.  If you have increased swelling in the neck and/or  are having difficulty breathing, CALL 911   Complete by: As directed    Call MD for:  redness, tenderness, or signs of infection (pain, swelling, bleeding, redness, odor or green/yellow discharge around incision site)   Complete by: As directed    Call MD for:  severe or increased pain, loss or decreased feeling  in affected limb(s)   Complete by: As directed    Call MD for:  temperature >100.5   Complete by: As directed     Discharge patient   Complete by: As directed    Discharge disposition: 01-Home or Self Care   Discharge patient date: 12/19/2021   Driving Restrictions   Complete by: As directed    Okay to drive when not taking narcotic pain medicine, and neck and feels comfortable.   Lifting restrictions   Complete by: As directed    No heavy lifting for 2 weeks.  Okay to resume normal activity.   Resume previous diet   Complete by: As directed       Allergies as of 12/20/2021   No Known Allergies      Medication List     TAKE these medications    acetaminophen-codeine 300-15 MG tablet Commonly known as: TYLENOL #2 Take 1 tablet by mouth every 4 (four) hours as needed for moderate pain.   amLODipine 10 MG tablet Commonly known as: NORVASC Take 10 mg by mouth daily.   Aspirin Low Dose 81 MG chewable tablet Generic drug: aspirin Chew 1 tablet (81 mg total) by mouth daily.   benazepril 20 MG tablet Commonly known as: LOTENSIN Take 0.5 tablets (10 mg total) by mouth 2 (two) times daily.   bisacodyl 5 MG EC tablet Commonly known as: DULCOLAX Take 10 mg by mouth every other day.   clopidogrel 75 MG tablet Commonly known as: PLAVIX Take 1 tablet (75 mg total) by mouth daily.   esomeprazole 40 MG capsule Commonly known as: NEXIUM Take 40 mg by mouth daily at 12 noon.   ezetimibe 10 MG tablet Commonly known as: Zetia Take 1 tablet (10  mg total) by mouth daily.   Fluad Quadrivalent 0.5 ML injection Generic drug: influenza vaccine adjuvanted Inject into the muscle.   levothyroxine 150 MCG tablet Commonly known as: SYNTHROID Take 150 mcg by mouth every morning.   oxybutynin 5 MG 24 hr tablet Commonly known as: DITROPAN-XL Take 5 mg by mouth daily.   pantoprazole 40 MG tablet Commonly known as: PROTONIX Take 40 mg by mouth daily.   Pfizer COVID-19 Vac Bivalent injection Generic drug: COVID-19 mRNA bivalent vaccine Therapist, music) Inject into the muscle.   Pfizer-BioNT COVID-19  Vac-TriS Susp injection Generic drug: COVID-19 mRNA Vac-TriS (Pfizer) Inject into the muscle.   rosuvastatin 40 MG tablet Commonly known as: Crestor Take 1 tablet (40 mg total) by mouth daily.   valACYclovir 1000 MG tablet Commonly known as: VALTREX Take 1,000 mg by mouth daily as needed (For cold sores).        Follow-up Information     [facility] Follow up in 1 month(s).   Why: The office will call the patient with an appointment Contact information: [address] [place] [phone]                Signed: Cherre Robins 12/20/2021, 6:32 AM

## 2021-12-24 DIAGNOSIS — I6521 Occlusion and stenosis of right carotid artery: Secondary | ICD-10-CM | POA: Diagnosis not present

## 2021-12-24 DIAGNOSIS — I639 Cerebral infarction, unspecified: Secondary | ICD-10-CM | POA: Diagnosis not present

## 2021-12-24 DIAGNOSIS — R531 Weakness: Secondary | ICD-10-CM | POA: Diagnosis not present

## 2021-12-24 DIAGNOSIS — M1990 Unspecified osteoarthritis, unspecified site: Secondary | ICD-10-CM | POA: Diagnosis not present

## 2021-12-24 DIAGNOSIS — E039 Hypothyroidism, unspecified: Secondary | ICD-10-CM | POA: Diagnosis not present

## 2021-12-24 DIAGNOSIS — R29898 Other symptoms and signs involving the musculoskeletal system: Secondary | ICD-10-CM | POA: Diagnosis not present

## 2021-12-24 DIAGNOSIS — I10 Essential (primary) hypertension: Secondary | ICD-10-CM | POA: Diagnosis not present

## 2021-12-24 DIAGNOSIS — E785 Hyperlipidemia, unspecified: Secondary | ICD-10-CM | POA: Diagnosis not present

## 2021-12-24 DIAGNOSIS — Z8673 Personal history of transient ischemic attack (TIA), and cerebral infarction without residual deficits: Secondary | ICD-10-CM | POA: Diagnosis not present

## 2021-12-24 DIAGNOSIS — R351 Nocturia: Secondary | ICD-10-CM | POA: Diagnosis not present

## 2021-12-24 DIAGNOSIS — E1169 Type 2 diabetes mellitus with other specified complication: Secondary | ICD-10-CM | POA: Diagnosis not present

## 2021-12-24 DIAGNOSIS — K219 Gastro-esophageal reflux disease without esophagitis: Secondary | ICD-10-CM | POA: Diagnosis not present

## 2022-01-08 DIAGNOSIS — R531 Weakness: Secondary | ICD-10-CM | POA: Diagnosis not present

## 2022-01-08 DIAGNOSIS — E785 Hyperlipidemia, unspecified: Secondary | ICD-10-CM | POA: Diagnosis not present

## 2022-01-08 DIAGNOSIS — R002 Palpitations: Secondary | ICD-10-CM | POA: Diagnosis not present

## 2022-01-08 DIAGNOSIS — E1169 Type 2 diabetes mellitus with other specified complication: Secondary | ICD-10-CM | POA: Diagnosis not present

## 2022-01-08 DIAGNOSIS — I4510 Unspecified right bundle-branch block: Secondary | ICD-10-CM | POA: Diagnosis not present

## 2022-01-08 DIAGNOSIS — I639 Cerebral infarction, unspecified: Secondary | ICD-10-CM | POA: Diagnosis not present

## 2022-01-08 DIAGNOSIS — I10 Essential (primary) hypertension: Secondary | ICD-10-CM | POA: Diagnosis not present

## 2022-01-08 DIAGNOSIS — Z9889 Other specified postprocedural states: Secondary | ICD-10-CM | POA: Diagnosis not present

## 2022-01-08 DIAGNOSIS — I6521 Occlusion and stenosis of right carotid artery: Secondary | ICD-10-CM | POA: Diagnosis not present

## 2022-01-08 DIAGNOSIS — E039 Hypothyroidism, unspecified: Secondary | ICD-10-CM | POA: Diagnosis not present

## 2022-01-23 ENCOUNTER — Other Ambulatory Visit: Payer: Self-pay | Admitting: *Deleted

## 2022-01-23 DIAGNOSIS — I6521 Occlusion and stenosis of right carotid artery: Secondary | ICD-10-CM

## 2022-02-11 ENCOUNTER — Encounter: Payer: Self-pay | Admitting: Neurology

## 2022-02-11 ENCOUNTER — Ambulatory Visit (INDEPENDENT_AMBULATORY_CARE_PROVIDER_SITE_OTHER): Payer: Medicare Other | Admitting: Neurology

## 2022-02-11 VITALS — BP 130/80 | HR 64 | Ht 72.0 in | Wt 181.6 lb

## 2022-02-11 DIAGNOSIS — I63239 Cerebral infarction due to unspecified occlusion or stenosis of unspecified carotid arteries: Secondary | ICD-10-CM

## 2022-02-11 DIAGNOSIS — I6529 Occlusion and stenosis of unspecified carotid artery: Secondary | ICD-10-CM | POA: Diagnosis not present

## 2022-02-11 DIAGNOSIS — I4510 Unspecified right bundle-branch block: Secondary | ICD-10-CM | POA: Insufficient documentation

## 2022-02-11 DIAGNOSIS — R29898 Other symptoms and signs involving the musculoskeletal system: Secondary | ICD-10-CM | POA: Diagnosis not present

## 2022-02-11 DIAGNOSIS — E7849 Other hyperlipidemia: Secondary | ICD-10-CM

## 2022-02-11 NOTE — Patient Instructions (Signed)
I had a long d/w patient and his wife about his recent stroke, carotid stenosis and revascularization with TCAR procedure,, risk for recurrent stroke/TIAs, personally independently reviewed imaging studies and stroke evaluation results and answered questions.Continue aspirin 81 mg daily and clopidogrel 75 mg daily  for secondary stroke prevention  for 6 months and then aspirin aloneand maintain strict control of hypertension with blood pressure goal below 130/90, diabetes with hemoglobin A1c goal below 6.5% and lipids with LDL cholesterol goal below 70 mg/dL. I also advised the patient to eat a healthy diet with plenty of whole grains, cereals, fruits and vegetables, exercise regularly and maintain ideal body weight Followup in the future with me in 6 months or call earlier if needed. Stroke Prevention Some medical conditions and behaviors can lead to a higher chance of having a stroke. You can help prevent a stroke by eating healthy, exercising, not smoking, and managing any medical conditions you have. Stroke is a leading cause of functional impairment. Primary prevention is particularly important because a majority of strokes are first-time events. Stroke changes the lives of not only those who experience a stroke but also their family and other caregivers. How can this condition affect me? A stroke is a medical emergency and should be treated right away. A stroke can lead to brain damage and can sometimes be life-threatening. If a person gets medical treatment right away, there is a better chance of surviving and recovering from a stroke. What can increase my risk? The following medical conditions may increase your risk of a stroke: Cardiovascular disease. High blood pressure (hypertension). Diabetes. High cholesterol. Sickle cell disease. Blood clotting disorders (hypercoagulable state). Obesity. Sleep disorders (obstructive sleep apnea). Other risk factors include: Being older than age  70. Having a history of blood clots, stroke, or mini-stroke (transient ischemic attack, TIA). Genetic factors, such as race, ethnicity, or a family history of stroke. Smoking cigarettes or using other tobacco products. Taking birth control pills, especially if you also use tobacco. Heavy use of alcohol or drugs, especially cocaine and methamphetamine. Physical inactivity. What actions can I take to prevent this? Manage your health conditions High cholesterol levels. Eating a healthy diet is important for preventing high cholesterol. If cholesterol cannot be managed through diet alone, you may need to take medicines. Take any prescribed medicines to control your cholesterol as told by your health care provider. Hypertension. To reduce your risk of stroke, try to keep your blood pressure below 130/80. Eating a healthy diet and exercising regularly are important for controlling blood pressure. If these steps are not enough to manage your blood pressure, you may need to take medicines. Take any prescribed medicines to control hypertension as told by your health care provider. Ask your health care provider if you should monitor your blood pressure at home. Have your blood pressure checked every year, even if your blood pressure is normal. Blood pressure increases with age and some medical conditions. Diabetes. Eating a healthy diet and exercising regularly are important parts of managing your blood sugar (glucose). If your blood sugar cannot be managed through diet and exercise, you may need to take medicines. Take any prescribed medicines to control your diabetes as told by your health care provider. Get evaluated for obstructive sleep apnea. Talk to your health care provider about getting a sleep evaluation if you snore a lot or have excessive sleepiness. Make sure that any other medical conditions you have, such as atrial fibrillation or atherosclerosis, are managed. Nutrition Follow  instructions  from your health care provider about what to eat or drink to help manage your health condition. These instructions may include: Reducing your daily calorie intake. Limiting how much salt (sodium) you use to 1,500 milligrams (mg) each day. Using only healthy fats for cooking, such as olive oil, canola oil, or sunflower oil. Eating healthy foods. You can do this by: Choosing foods that are high in fiber, such as whole grains, and fresh fruits and vegetables. Eating at least 5 servings of fruits and vegetables a day. Try to fill one-half of your plate with fruits and vegetables at each meal. Choosing lean protein foods, such as lean cuts of meat, poultry without skin, fish, tofu, beans, and nuts. Eating low-fat dairy products. Avoiding foods that are high in sodium. This can help lower blood pressure. Avoiding foods that have saturated fat, trans fat, and cholesterol. This can help prevent high cholesterol. Avoiding processed and prepared foods. Counting your daily carbohydrate intake.  Lifestyle If you drink alcohol: Limit how much you have to: 0-1 drink a day for women who are not pregnant. 0-2 drinks a day for men. Know how much alcohol is in your drink. In the [place] one drink equals one 12 oz bottle of beer (391m), one 5 oz glass of wine (1473m, or one 1 oz glass of hard liquor (4458m Do not use any products that contain nicotine or tobacco. These products include cigarettes, chewing tobacco, and vaping devices, such as e-cigarettes. If you need help quitting, ask your health care provider. Avoid secondhand smoke. Do not use drugs. Activity  Try to stay at a healthy weight. Get at least 30 minutes of exercise on most days, such as: Fast walking. Biking. Swimming. Medicines Take over-the-counter and prescription medicines only as told by your health care provider. Aspirin or blood thinners (antiplatelets or anticoagulants) may be recommended to reduce your risk of  forming blood clots that can lead to stroke. Avoid taking birth control pills. Talk to your health care provider about the risks of taking birth control pills if: You are over 35 31ars old. You smoke. You get very bad headaches. You have had a blood clot. Where to find more information American Stroke Association: www.strokeassociation.org Get help right away if: You or a loved one has any symptoms of a stroke. "BE FAST" is an easy way to remember the main warning signs of a stroke: B - Balance. Signs are dizziness, sudden trouble walking, or loss of balance. E - Eyes. Signs are trouble seeing or a sudden change in vision. F - Face. Signs are sudden weakness or numbness of the face, or the face or eyelid drooping on one side. A - Arms. Signs are weakness or numbness in an arm. This happens suddenly and usually on one side of the body. S - Speech. Signs are sudden trouble speaking, slurred speech, or trouble understanding what people say. T - Time. Time to call emergency services. Write down what time symptoms started. You or a loved one has other signs of a stroke, such as: A sudden, severe headache with no known cause. Nausea or vomiting. Seizure. These symptoms may represent a serious problem that is an emergency. Do not wait to see if the symptoms will go away. Get medical help right away. Call your local emergency services (911 in the [place] Do not drive yourself to the hospital. Summary You can help to prevent a stroke by eating healthy, exercising, not smoking, limiting alcohol intake, and managing any medical conditions you  may have. Do not use any products that contain nicotine or tobacco. These include cigarettes, chewing tobacco, and vaping devices, such as e-cigarettes. If you need help quitting, ask your health care provider. Remember "BE FAST" for warning signs of a stroke. Get help right away if you or a loved one has any of these signs. This information is not intended to  replace advice given to you by your health care provider. Make sure you discuss any questions you have with your health care provider. Document Revised: 02/19/2020 Document Reviewed: 02/19/2020 Elsevier Patient Education  [place].

## 2022-02-11 NOTE — Progress Notes (Signed)
[facility] [address] [place]. [place] [address] [phone]       OFFICE CONSULT NOTE  Mr. Nathaniel Mcclain Date of Birth:  July 14, 1936 Medical Record Number:  [id number]   Referring MD: Nathaniel Mcclain  Reason for Referral: Symptomatic carotid stenosis  HPI: Nathaniel Mcclain is a 86 year old pleasant Caucasian male seen today for initial office consultation visit for symptomatic carotid stenosis.  History is obtained from the patient and his wife and review of electronic medical records and I personally reviewed pertinent available imaging films in PACS.  He has past medical history of diabetes, hypertension, hyperlipidemia and remote history of TIA 20 years ago.  He presented on 12/10/21 with 5-day history of left hand weakness and clumsiness and had an outpatient MRI scan which showed patchy right frontal MCA embolic infarcts.  CT angiogram showed heavily calcified 80% proximal right ICA stenosis and 50% left ICA stenosis.  Echocardiogram showed normal ejection fraction without cardiac source of embolism.  Hemoglobin A1c was 6.3.  LDL cholesterol was elevated at 96 mg percent.  Patient was started on aspirin and Plavix and on Crestor.  He saw Dr. Luan Mcclain vascular surgeon who did TCAR procedure for right carotid revascularization on 12/18/2021.  Procedure went well and patient has done well has had no recurrent stroke or TIA symptoms.  He is tolerating aspirin Plavix well without bruising or bleeding.  Tolerating Crestor well without muscle aches and pains and follow-up lipid profile on 12/19/2021 showed that LDL had come down to 90 mg percent.  Patient states he has noticed significant improvement in left hand weakness and has practically no complaints.  He is scheduled to see a vascular surgeon next week for follow-up.  He has no new complaints today.  He did have remote history of TIA 20 years ago family had vision disturbances which lasted a couple of days and resolved.  His primary care  physician put him on aspirin and blood pressure medication at that time.  Recently his blood pressure medications have been increased by primary care physician.  His blood pressure today is quite well controlled at 130/80.  ROS:   14 system review of systems is positive for hand weakness, clumsiness all other systems negative  PMH:  Past Medical History:  Diagnosis Date   Arthritis    Colon polyps    Diabetes ([place])    pt denies, and does not take any medications   GERD (gastroesophageal reflux disease)    Hepatitis    hx of Hepatitis A   Hyperlipidemia    Hypertension    Hypothyroid    Osteoarthritis    TIA (transient ischemic attack) 1980    Social History:  Social History   Socioeconomic History   Marital status: Married    Spouse name: Not on file   Number of children: 1   Years of education: Not on file   Highest education level: Not on file  Occupational History   Not on file  Tobacco Use   Smoking status: Never   Smokeless tobacco: Never  Vaping Use   Vaping Use: Never used  Substance and Sexual Activity   Alcohol use: No    Alcohol/week: 0.0 standard drinks of alcohol   Drug use: No   Sexual activity: Not on file  Other Topics Concern   Not on file  Social History Narrative   Not on file   Social Determinants of Health   Financial Resource Strain: Not on file  Food Insecurity: Not  on file  Transportation Needs: Not on file  Physical Activity: Not on file  Stress: Not on file  Social Connections: Not on file  Intimate Partner Violence: Not on file    Medications:   Current Outpatient Medications on File Prior to Visit  Medication Sig Dispense Refill   amLODipine (NORVASC) 10 MG tablet Take 10 mg by mouth daily.     aspirin 81 MG chewable tablet Chew 1 tablet (81 mg total) by mouth daily. 30 tablet 0   benazepril (LOTENSIN) 20 MG tablet Take 0.5 tablets (10 mg total) by mouth 2 (two) times daily. (Patient taking differently: Take 20 mg by mouth 2  (two) times daily.)     bisacodyl (DULCOLAX) 5 MG EC tablet Take 10 mg by mouth every other day.     clopidogrel (PLAVIX) 75 MG tablet Take 1 tablet (75 mg total) by mouth daily. 30 tablet 0   esomeprazole (NEXIUM) 40 MG capsule Take 40 mg by mouth daily at 12 noon.     ezetimibe (ZETIA) 10 MG tablet Take 1 tablet (10 mg total) by mouth daily. 30 tablet 0   levothyroxine (SYNTHROID) 150 MCG tablet Take 150 mcg by mouth every morning.     Omega-3 Fatty Acids (FISH OIL PO) Take by mouth.     oxybutynin (DITROPAN-XL) 5 MG 24 hr tablet Take 5 mg by mouth daily.     pantoprazole (PROTONIX) 40 MG tablet Take 40 mg by mouth daily.     rosuvastatin (CRESTOR) 40 MG tablet Take 1 tablet (40 mg total) by mouth daily. 30 tablet 0   senna (SENOKOT) 8.6 MG TABS tablet Take 1 tablet by mouth.     Current Facility-Administered Medications on File Prior to Visit  Medication Dose Route Frequency Provider Last Rate Last Admin   0.9 %  sodium chloride infusion  500 mL Intravenous Continuous Nathaniel Shipper, MD        Allergies:   Allergies  Allergen Reactions   Codeine     Other reaction(s): Unknown   Prednisone     Other reaction(s): hives    Physical Exam General: well developed, well nourished, pleasant elderly Caucasian male seated, in no evident distress Head: head normocephalic and atraumatic.   Neck: supple with no carotid or supraclavicular bruits Cardiovascular: regular rate and rhythm, no murmurs Musculoskeletal: no deformity except mild kyphoscoliosis Skin:  no rash/petichiae.  TCAR surgery scar on the neck on the right Vascular:  Normal pulses all extremities  Neurologic Exam Mental Status: Awake and fully alert. Oriented to place and time. Recent and remote memory intact. Attention span, concentration and fund of knowledge appropriate. Mood and affect appropriate.  Cranial Nerves: Fundoscopic exam reveals sharp disc margins. Pupils equal, briskly reactive to light. Extraocular movements  full without nystagmus. Visual fields full to confrontation. Hearing intact. Facial sensation intact. Face, tongue, palate moves normally and symmetrically.  Motor: Normal bulk and tone. Normal strength in all tested extremity muscles.  Diminished fine finger movements on the left.  Orbits right over left upper extremity. Sensory.: intact to touch , pinprick , position and vibratory sensation.  Coordination: Rapid alternating movements normal in all extremities. Finger-to-nose and heel-to-shin performed accurately bilaterally. Gait and Station: Arises from chair without difficulty. Stance is normal. Gait demonstrates normal stride length and balance . Able to heel, toe and tandem walk with moderate difficulty.  Reflexes: 1+ and symmetric. Toes downgoing.   NIHSS  0 Modified Rankin  1   ASSESSMENT: 86 year old Caucasian male  with right frontal MCA branch infarct secondary to symptomatic high-grade proximal right carotid stenosis in May 2023 s/p revascularization with TCAR procedure.  Vascular risk factors of carotid stenosis, hyperlipidemia hypertension and age.  He is doing well with practically no residual deficits     PLAN: I had a long d/w patient and his wife about his recent stroke, carotid stenosis and revascularization with TCAR procedure,, risk for recurrent stroke/TIAs, personally independently reviewed imaging studies and stroke evaluation results and answered questions.Continue aspirin 81 mg daily and clopidogrel 75 mg daily  for secondary stroke prevention  for 6 months and then aspirin aloneand maintain strict control of hypertension with blood pressure goal below 130/90, diabetes with hemoglobin A1c goal below 6.5% and lipids with LDL cholesterol goal below 70 mg/dL. I also advised the patient to eat a healthy diet with plenty of whole grains, cereals, fruits and vegetables, exercise regularly and maintain ideal body weight Followup in the future with me in 6 months or call earlier if  needed.  Greater than 50% time during this 45-minute consultation visit was spent in counseling and coordination of care about his symptomatic carotid stenosis and discussion about stroke prevention and treatment and answering questions. Antony Contras, MD  Note: This document was prepared with digital dictation and possible smart phrase technology. Any transcriptional errors that result from this process are unintentional.

## 2022-02-13 ENCOUNTER — Ambulatory Visit (HOSPITAL_COMMUNITY)
Admission: RE | Admit: 2022-02-13 | Discharge: 2022-02-13 | Disposition: A | Discharge status: Home / Self Care | Payer: Medicare Other | Source: Ambulatory Visit | Attending: Vascular Surgery | Admitting: Vascular Surgery

## 2022-02-13 ENCOUNTER — Encounter: Payer: Self-pay | Admitting: Vascular Surgery

## 2022-02-13 ENCOUNTER — Ambulatory Visit (INDEPENDENT_AMBULATORY_CARE_PROVIDER_SITE_OTHER): Payer: Medicare Other | Admitting: Vascular Surgery

## 2022-02-13 VITALS — BP 148/79 | HR 83 | Temp 98.4°F | Resp 20 | Ht 72.0 in | Wt 180.0 lb

## 2022-02-13 DIAGNOSIS — I6521 Occlusion and stenosis of right carotid artery: Secondary | ICD-10-CM | POA: Diagnosis not present

## 2022-02-13 NOTE — Progress Notes (Signed)
Office Note    HPI: Nathaniel Mcclain is a 86 y.o. (1935/12/18) male presenting in postoperative follow-up status post right-sided transcarotid artery revascularization.  Patient underwent right-sided TCAR for symptomatic ICA stenosis with placement of 10 x 40 mm stent.  Deficit at the time included left upper extremity numbness, the weakness had resolved preoperatively.  On exam today, Nathaniel Mcclain was doing well, accompanied by his son.  He denied symptoms of TIA, amaurosis, stroke.  No wound healing concerns.  He has regained independence, and drove both he and his son over to clinic today and his 2003 Cadillac.  He continues to be compliant with aspirin, Plavix, high intensity statin   Past Medical History:  Diagnosis Date   Arthritis    Colon polyps    Diabetes ([place])    pt denies, and does not take any medications   GERD (gastroesophageal reflux disease)    Hepatitis    hx of Hepatitis A   Hyperlipidemia    Hypertension    Hypothyroid    Osteoarthritis    TIA (transient ischemic attack) 1980    Past Surgical History:  Procedure Laterality Date   THYROIDECTOMY  2008   TRANSCAROTID ARTERY REVASCULARIZATION  Right 12/18/2021   Procedure: Right Transcarotid Artery Revascularization;  Surgeon: Broadus John, MD;  Location: [facility] OR;  Service: Vascular;  Laterality: Right;    Social History   Socioeconomic History   Marital status: Married    Spouse name: Not on file   Number of children: 1   Years of education: Not on file   Highest education level: Not on file  Occupational History   Not on file  Tobacco Use   Smoking status: Never   Smokeless tobacco: Never  Vaping Use   Vaping Use: Never used  Substance and Sexual Activity   Alcohol use: No    Alcohol/week: 0.0 standard drinks of alcohol   Drug use: No   Sexual activity: Not on file  Other Topics Concern   Not on file  Social History Narrative   Not on file   Social Determinants of Health   Financial Resource Strain:  Not on file  Food Insecurity: Not on file  Transportation Needs: Not on file  Physical Activity: Not on file  Stress: Not on file  Social Connections: Not on file  Intimate Partner Violence: Not on file   Family History  Problem Relation Age of Onset   Diabetes Mother    Heart disease Mother    Colon cancer Neg Hx     Current Outpatient Medications  Medication Sig Dispense Refill   amLODipine (NORVASC) 10 MG tablet Take 10 mg by mouth daily.     aspirin 81 MG chewable tablet Chew 1 tablet (81 mg total) by mouth daily. 30 tablet 0   benazepril (LOTENSIN) 20 MG tablet Take 0.5 tablets (10 mg total) by mouth 2 (two) times daily. (Patient taking differently: Take 20 mg by mouth 2 (two) times daily.)     bisacodyl (DULCOLAX) 5 MG EC tablet Take 10 mg by mouth every other day.     clopidogrel (PLAVIX) 75 MG tablet Take 1 tablet (75 mg total) by mouth daily. 30 tablet 0   esomeprazole (NEXIUM) 40 MG capsule Take 40 mg by mouth daily at 12 noon.     ezetimibe (ZETIA) 10 MG tablet Take 1 tablet (10 mg total) by mouth daily. 30 tablet 0   levothyroxine (SYNTHROID) 150 MCG tablet Take 150 mcg by mouth every morning.  Omega-3 Fatty Acids (FISH OIL PO) Take by mouth.     oxybutynin (DITROPAN-XL) 5 MG 24 hr tablet Take 5 mg by mouth daily.     pantoprazole (PROTONIX) 40 MG tablet Take 40 mg by mouth daily.     rosuvastatin (CRESTOR) 40 MG tablet Take 1 tablet (40 mg total) by mouth daily. 30 tablet 0   senna (SENOKOT) 8.6 MG TABS tablet Take 1 tablet by mouth.     Current Facility-Administered Medications  Medication Dose Route Frequency Provider Last Rate Last Admin   0.9 %  sodium chloride infusion  500 mL Intravenous Continuous Irene Shipper, MD        Allergies  Allergen Reactions   Codeine     Other reaction(s): Unknown   Prednisone     Other reaction(s): hives     REVIEW OF SYSTEMS:  '[X]'$  denotes positive finding, '[ ]'$  denotes negative finding Cardiac  Comments:  Chest pain  or chest pressure:    Shortness of breath upon exertion:    Short of breath when lying flat:    Irregular heart rhythm:        Vascular    Pain in calf, thigh, or hip brought on by ambulation:    Pain in feet at night that wakes you up from your sleep:     Blood clot in your veins:    Leg swelling:         Pulmonary    Oxygen at home:    Productive cough:     Wheezing:         Neurologic    Sudden weakness in arms or legs:     Sudden numbness in arms or legs:     Sudden onset of difficulty speaking or slurred speech:    Temporary loss of vision in one eye:     Problems with dizziness:         Gastrointestinal    Blood in stool:     Vomited blood:         Genitourinary    Burning when urinating:     Blood in urine:        Psychiatric    Major depression:         Hematologic    Bleeding problems:    Problems with blood clotting too easily:        Skin    Rashes or ulcers:        Constitutional    Fever or chills:      PHYSICAL EXAMINATION:  Vitals:   02/13/22 1526  BP: (!) 148/79  Pulse: 83  Resp: 20  Temp: 98.4 F (36.9 C)  SpO2: 99%  Weight: 180 lb (81.6 kg)  Height: 6' (1.829 m)    General:  WDWN in NAD; vital signs documented above Gait: Not observed HENT: WNL, normocephalic, right neck incision site healed well Pulmonary: normal non-labored breathing  Cardiac: regular HR Abdomen: soft, NT, no masses Skin: without rashes Vascular Exam/Pulses:  Right Left  Radial 2+ (normal) 2+ (normal)  Ulnar 2+ (normal) 2+ (normal)                   Extremities: without ischemic changes, without Gangrene , without cellulitis; without open wounds;  Musculoskeletal: no muscle wasting or atrophy  Neurologic: A&O X 3;  No focal weakness or paresthesias are detected Psychiatric:  The pt has Normal affect.   Non-Invasive Vascular Imaging:   Right Carotid Findings:  +----------+--------+--------+--------+------------------+--------+  PSV  cm/sEDV cm/sStenosisPlaque DescriptionComments  +----------+--------+--------+--------+------------------+--------+  CCA Prox  71      10                                          +----------+--------+--------+--------+------------------+--------+  CCA Mid   83      11                                          +----------+--------+--------+--------+------------------+--------+  ICA Mid   102     24                                          +----------+--------+--------+--------+------------------+--------+  ICA Distal73      19                                          +----------+--------+--------+--------+------------------+--------+  ECA       241     20      >50%    calcific                    +----------+--------+--------+--------+------------------+--------+   +----------+--------+-------+----------------+-------------------+            PSV cm/sEDV cmsDescribe        Arm Pressure (mmHG)  +----------+--------+-------+----------------+-------------------+  Subclavian110            Multiphasic, [id number]                  +----------+--------+-------+----------------+-------------------+   +---------+--------+--+--------+--+---------+  VertebralPSV cm/s67EDV cm/s16Antegrade  +---------+--------+--+--------+--+---------+       Right Stent(s):  +---------------+---+--++++  Prox to Stent  80 13  +---------------+---+--++++  Proximal Stent 82 17  +---------------+---+--++++  Mid Stent      12222  +---------------+---+--++++  Distal Stent   11724  +---------------+---+--++++  Distal to Stent10224  +---------------+---+--++++            Left Carotid Findings:  +----------+--------+--------+--------+------------------+---------+            PSV cm/sEDV cm/sStenosisPlaque DescriptionComments   +----------+--------+--------+--------+------------------+---------+  CCA Prox  104      19                                           +----------+--------+--------+--------+------------------+---------+  CCA Mid   115     22              calcific                     +----------+--------+--------+--------+------------------+---------+  CCA Distal95      24              calcific          Shadowing  +----------+--------+--------+--------+------------------+---------+  ICA Prox  108     31      1-39%   calcific          Shadowing  +----------+--------+--------+--------+------------------+---------+  ICA Mid   75      21                                           +----------+--------+--------+--------+------------------+---------+  ICA Distal104     36                                           +----------+--------+--------+--------+------------------+---------+  ECA       103     11                                           +----------+--------+--------+--------+------------------+---------+   +----------+--------+--------+----------------+-------------------+            PSV cm/sEDV cm/sDescribe        Arm Pressure (mmHG)  +----------+--------+--------+----------------+-------------------+  [id number]             Multiphasic, MGN003                  +----------+--------+--------+----------------+-------------------+   +---------+--------+--+--------+--+---------+  VertebralPSV cm/s54EDV cm/s12Antegrade  +---------+--------+--+--------+--+---------+           Summary:  Right Carotid: The ECA appears >50% stenosed. Patent stent with no  evidence of                 stenosis.   Left Carotid: Velocities in the left ICA are consistent with a 1-39%  stenosis.   Vertebrals:  Bilateral vertebral arteries demonstrate antegrade flow.  Subclavians: Normal flow hemodynamics were seen in bilateral subclavian               arteries.     ASSESSMENT/PLAN: RIYAAN HEROUX is a 86 y.o. male presenting status post  12/18/2021 right-sided TCAR for symptomatic ICA stenosis.  Bilateral carotid duplex ultrasound reviewed demonstrating no stenosis within the stent, mild stenosis in the contralateral ICA.  I plan to see Nathaniel Mcclain back in the office in 9 months with repeat duplex ultrasound.  I asked that he continue his current medication regimen and call should any questions or concerns arise.   Broadus John, MD [facility] [phone]

## 2022-02-20 ENCOUNTER — Other Ambulatory Visit: Payer: Self-pay

## 2022-02-20 DIAGNOSIS — I6529 Occlusion and stenosis of unspecified carotid artery: Secondary | ICD-10-CM

## 2022-03-12 DIAGNOSIS — G8929 Other chronic pain: Secondary | ICD-10-CM | POA: Diagnosis not present

## 2022-03-12 DIAGNOSIS — M25551 Pain in right hip: Secondary | ICD-10-CM | POA: Diagnosis not present

## 2022-03-12 DIAGNOSIS — R531 Weakness: Secondary | ICD-10-CM | POA: Diagnosis not present

## 2022-03-12 DIAGNOSIS — I639 Cerebral infarction, unspecified: Secondary | ICD-10-CM | POA: Diagnosis not present

## 2022-03-12 DIAGNOSIS — N3281 Overactive bladder: Secondary | ICD-10-CM | POA: Diagnosis not present

## 2022-03-12 DIAGNOSIS — R5383 Other fatigue: Secondary | ICD-10-CM | POA: Diagnosis not present

## 2022-03-12 DIAGNOSIS — E039 Hypothyroidism, unspecified: Secondary | ICD-10-CM | POA: Diagnosis not present

## 2022-03-12 DIAGNOSIS — E1169 Type 2 diabetes mellitus with other specified complication: Secondary | ICD-10-CM | POA: Diagnosis not present

## 2022-03-24 DIAGNOSIS — M25551 Pain in right hip: Secondary | ICD-10-CM | POA: Diagnosis not present

## 2022-03-24 DIAGNOSIS — S76311A Strain of muscle, fascia and tendon of the posterior muscle group at thigh level, right thigh, initial encounter: Secondary | ICD-10-CM | POA: Diagnosis not present

## 2022-05-07 DIAGNOSIS — Z77122 Contact with and (suspected) exposure to noise: Secondary | ICD-10-CM | POA: Diagnosis not present

## 2022-05-07 DIAGNOSIS — H93292 Other abnormal auditory perceptions, left ear: Secondary | ICD-10-CM | POA: Diagnosis not present

## 2022-05-07 DIAGNOSIS — H6122 Impacted cerumen, left ear: Secondary | ICD-10-CM | POA: Diagnosis not present

## 2022-05-11 DIAGNOSIS — R2681 Unsteadiness on feet: Secondary | ICD-10-CM | POA: Diagnosis not present

## 2022-05-11 DIAGNOSIS — Z23 Encounter for immunization: Secondary | ICD-10-CM | POA: Diagnosis not present

## 2022-05-11 DIAGNOSIS — N3281 Overactive bladder: Secondary | ICD-10-CM | POA: Diagnosis not present

## 2022-05-11 DIAGNOSIS — I10 Essential (primary) hypertension: Secondary | ICD-10-CM | POA: Diagnosis not present

## 2022-05-11 DIAGNOSIS — E1169 Type 2 diabetes mellitus with other specified complication: Secondary | ICD-10-CM | POA: Diagnosis not present

## 2022-05-16 DIAGNOSIS — Z23 Encounter for immunization: Secondary | ICD-10-CM | POA: Diagnosis not present

## 2022-05-20 DIAGNOSIS — M79605 Pain in left leg: Secondary | ICD-10-CM | POA: Diagnosis not present

## 2022-05-20 DIAGNOSIS — S80812A Abrasion, left lower leg, initial encounter: Secondary | ICD-10-CM | POA: Diagnosis not present

## 2022-06-30 ENCOUNTER — Encounter: Payer: Self-pay | Admitting: Internal Medicine

## 2022-08-13 ENCOUNTER — Encounter: Payer: Self-pay | Admitting: Internal Medicine

## 2022-08-13 ENCOUNTER — Ambulatory Visit (INDEPENDENT_AMBULATORY_CARE_PROVIDER_SITE_OTHER): Payer: Medicare Other | Admitting: Internal Medicine

## 2022-08-13 VITALS — BP 138/78 | HR 67 | Ht 72.0 in | Wt 175.0 lb

## 2022-08-13 DIAGNOSIS — K2100 Gastro-esophageal reflux disease with esophagitis, without bleeding: Secondary | ICD-10-CM | POA: Diagnosis not present

## 2022-08-13 DIAGNOSIS — R1310 Dysphagia, unspecified: Secondary | ICD-10-CM | POA: Diagnosis not present

## 2022-08-13 DIAGNOSIS — Z7902 Long term (current) use of antithrombotics/antiplatelets: Secondary | ICD-10-CM

## 2022-08-13 NOTE — Patient Instructions (Signed)
_______________________________________________________  If you are age 85 or older, your body mass index should be between 23-30. Your Body mass index is 23.73 kg/m. If this is out of the aforementioned range listed, please consider follow up with your Primary Care Provider.  If you are age 33 or younger, your body mass index should be between 19-25. Your Body mass index is 23.73 kg/m. If this is out of the aformentioned range listed, please consider follow up with your Primary Care Provider.   ________________________________________________________  The [facility] providers would like to encourage you to use [facility] to communicate with providers for non-urgent requests or questions.  Due to long hold times on the telephone, sending your provider a message by [facility] may be a faster and more efficient way to get a response.  Please allow 48 business hours for a response.  Please remember that this is for non-urgent requests.  _______________________________________________________  Nathaniel Mcclain have been scheduled for an endoscopy. Please follow written instructions given to you at your visit today. If you use inhalers (even only as needed), please bring them with you on the day of your procedure.

## 2022-08-13 NOTE — Progress Notes (Signed)
HISTORY OF PRESENT ILLNESS:  Nathaniel Mcclain is a 87 y.o. male with multiple medical problems as listed below including history of cerebrovascular disease with TIA for which she is status post right carotid artery revascularization procedure with stent placement, on Plavix.  He presents today with a chief complaint of throat irritation and coughing with certain foods.  He was last seen in this office October 2018 regarding Hemoccult positive stool.  See that dictation for details.  He subsequently underwent colonoscopy December 2018 and was found to have diverticulosis with severe rectosigmoid stenosis, diminutive polyps, and hemorrhoids.  No future follow-up recommended.  Patient tells me that his prior history dates back approximately 1 year.  He has noticed that when eating dry or flaky foods he will get tingling irritation in the back of his throat which elicits cough response.  He does not describe true esophageal dysphagia.  He does have a history of GERD for which he takes pantoprazole 40 mg daily.  This seems to control reflux symptoms.  He did undergo upper endoscopy remotely in December 2002 to evaluate dysphagia and reflux symptoms.  He was found to have distal esophageal stricture with esophagitis and a hiatal hernia.  He was treated medically.  Symptoms resolved.  Patient denies weight loss or vomiting.  No neck pain.  REVIEW OF SYSTEMS:  All non-GI ROS negative unless otherwise stated in the HPI except for arthritis  Past Medical History:  Diagnosis Date   Arthritis    Colon polyps    CVA (cerebral vascular accident) ([place]) 12/2021   Diabetes ([place])    pt denies, and does not take any medications   GERD (gastroesophageal reflux disease)    Hepatitis    hx of Hepatitis A   Hyperlipidemia    Hypertension    Hypothyroid    Osteoarthritis    TIA (transient ischemic attack) 1980    Past Surgical History:  Procedure Laterality Date   THYROIDECTOMY  2008   TRANSCAROTID ARTERY  REVASCULARIZATION  Right 12/18/2021   Procedure: Right Transcarotid Artery Revascularization;  Surgeon: Broadus Yaw Escoto, MD;  Location: [place];  Service: Vascular;  Laterality: Right;    Social History Nathaniel Mcclain  reports that he has never smoked. He has never used smokeless tobacco. He reports that he does not drink alcohol and does not use drugs.  family history includes Diabetes in his mother; Heart disease in his mother.  Allergies  Allergen Reactions   Codeine     Other reaction(s): Unknown   Prednisone     Other reaction(s): hives       PHYSICAL EXAMINATION: Vital signs: BP 138/78   Pulse 67   Ht 6' (1.829 m)   Wt 175 lb (79.4 kg)   SpO2 97%   BMI 23.73 kg/m   Constitutional: generally well-appearing, no acute distress Psychiatric: alert and oriented x 3, cooperative Eyes: extraocular movements intact, anicteric, conjunctiva pink Mouth: oral pharynx moist, no lesions Neck: supple no lymphadenopathy.  Prior thyroidectomy scar well-healed Cardiovascular: heart regular rate and rhythm, no murmur Lungs: clear to auscultation bilaterally Abdomen: soft, nontender, nondistended, no obvious ascites, no peritoneal signs, normal bowel sounds, no organomegaly Rectal: Omitted Extremities: no clubbing, cyanosis, or lower extremity edema bilaterally Skin: no lesions on visible extremities Neuro: No focal deficits.  Hard of hearing  ASSESSMENT:  1.  Dysphagia.  Sounds like dry flaky foods irritating the throat rather than true esophageal dysphagia.  Should investigate, to be started. 2.  GERD with a  history of esophagitis and peptic stricture.  On PPI 3.  Multiple significant medical problems including cerebrovascular disease with prior TIA status post cerebral artery intervention 4.  Advanced age   PLAN:  24.  Upper endoscopy.  Patient is high risk given his age and comorbidities.  Patient will remain ON PLAVIX for this exam.  No therapeutics planned initially.The nature of  the procedure, as well as the risks, benefits, and alternatives were carefully and thoroughly reviewed with the patient. Ample time for discussion and questions allowed. The patient understood, was satisfied, and agreed to proceed. 2.  Continue PPI 3.  Ongoing general medical care with Dr. Reynaldo Minium

## 2022-08-21 DIAGNOSIS — I739 Peripheral vascular disease, unspecified: Secondary | ICD-10-CM | POA: Diagnosis not present

## 2022-08-21 DIAGNOSIS — M79672 Pain in left foot: Secondary | ICD-10-CM | POA: Diagnosis not present

## 2022-08-21 DIAGNOSIS — M79671 Pain in right foot: Secondary | ICD-10-CM | POA: Diagnosis not present

## 2022-08-21 DIAGNOSIS — L603 Nail dystrophy: Secondary | ICD-10-CM | POA: Diagnosis not present

## 2022-08-21 DIAGNOSIS — L84 Corns and callosities: Secondary | ICD-10-CM | POA: Diagnosis not present

## 2022-08-24 ENCOUNTER — Ambulatory Visit: Payer: Medicare Other | Admitting: Neurology

## 2022-08-31 ENCOUNTER — Ambulatory Visit (AMBULATORY_SURGERY_CENTER): Payer: Medicare Other | Admitting: Internal Medicine

## 2022-08-31 ENCOUNTER — Encounter: Payer: Self-pay | Admitting: Internal Medicine

## 2022-08-31 VITALS — BP 125/72 | HR 58 | Temp 97.3°F | Resp 13 | Ht 72.0 in | Wt 175.0 lb

## 2022-08-31 DIAGNOSIS — R1310 Dysphagia, unspecified: Secondary | ICD-10-CM

## 2022-08-31 DIAGNOSIS — I10 Essential (primary) hypertension: Secondary | ICD-10-CM | POA: Diagnosis not present

## 2022-08-31 DIAGNOSIS — K2100 Gastro-esophageal reflux disease with esophagitis, without bleeding: Secondary | ICD-10-CM

## 2022-08-31 DIAGNOSIS — Z7901 Long term (current) use of anticoagulants: Secondary | ICD-10-CM | POA: Diagnosis not present

## 2022-08-31 DIAGNOSIS — E039 Hypothyroidism, unspecified: Secondary | ICD-10-CM | POA: Diagnosis not present

## 2022-08-31 MED ORDER — SODIUM CHLORIDE 0.9 % IV SOLN: 500.0000 mL | Freq: Once | INTRAVENOUS | Status: DC

## 2022-08-31 NOTE — Progress Notes (Signed)
Pt resting comfortably. VSS. Airway intact. SBAR complete to RN. All questions answered.   

## 2022-08-31 NOTE — Progress Notes (Signed)
HISTORY OF PRESENT ILLNESS:   Nathaniel Mcclain is a 87 y.o. male with multiple medical problems as listed below including history of cerebrovascular disease with TIA for which she is status post right carotid artery revascularization procedure with stent placement, on Plavix.  He presents today with a chief complaint of throat irritation and coughing with certain foods.  He was last seen in this office October 2018 regarding Hemoccult positive stool.  See that dictation for details.  He subsequently underwent colonoscopy December 2018 and was found to have diverticulosis with severe rectosigmoid stenosis, diminutive polyps, and hemorrhoids.  No future follow-up recommended.   Patient tells me that his prior history dates back approximately 1 year.  He has noticed that when eating dry or flaky foods he will get tingling irritation in the back of his throat which elicits cough response.  He does not describe true esophageal dysphagia.  He does have a history of GERD for which he takes pantoprazole 40 mg daily.  This seems to control reflux symptoms.  He did undergo upper endoscopy remotely in December 2002 to evaluate dysphagia and reflux symptoms.  He was found to have distal esophageal stricture with esophagitis and a hiatal hernia.  He was treated medically.  Symptoms resolved.   Patient denies weight loss or vomiting.  No neck pain.   REVIEW OF SYSTEMS:   All non-GI ROS negative unless otherwise stated in the HPI except for arthritis       Past Medical History:  Diagnosis Date   Arthritis     Colon polyps     CVA (cerebral vascular accident) ([place]) 12/2021   Diabetes ([place])      pt denies, and does not take any medications   GERD (gastroesophageal reflux disease)     Hepatitis      hx of Hepatitis A   Hyperlipidemia     Hypertension     Hypothyroid     Osteoarthritis     TIA (transient ischemic attack) 1980           Past Surgical History:  Procedure Laterality Date   THYROIDECTOMY   2008    TRANSCAROTID ARTERY REVASCULARIZATION  Right 12/18/2021    Procedure: Right Transcarotid Artery Revascularization;  Surgeon: Broadus Rylee Huestis, MD;  Location: [place];  Service: Vascular;  Laterality: Right;      Social History Nathaniel Mcclain  reports that he has never smoked. He has never used smokeless tobacco. He reports that he does not drink alcohol and does not use drugs.   family history includes Diabetes in his mother; Heart disease in his mother.        Allergies  Allergen Reactions   Codeine        Other reaction(s): Unknown   Prednisone        Other reaction(s): hives          PHYSICAL EXAMINATION: Vital signs: BP 138/78   Pulse 67   Ht 6' (1.829 m)   Wt 175 lb (79.4 kg)   SpO2 97%   BMI 23.73 kg/m   Constitutional: generally well-appearing, no acute distress Psychiatric: alert and oriented x 3, cooperative Eyes: extraocular movements intact, anicteric, conjunctiva pink Mouth: oral pharynx moist, no lesions Neck: supple no lymphadenopathy.  Prior thyroidectomy scar well-healed Cardiovascular: heart regular rate and rhythm, no murmur Lungs: clear to auscultation bilaterally Abdomen: soft, nontender, nondistended, no obvious ascites, no peritoneal signs, normal bowel sounds, no organomegaly Rectal: Omitted Extremities: no clubbing, cyanosis, or lower  extremity edema bilaterally Skin: no lesions on visible extremities Neuro: No focal deficits.  Hard of hearing   ASSESSMENT:   1.  Dysphagia.  Sounds like dry flaky foods irritating the throat rather than true esophageal dysphagia.  Should investigate, to be started. 2.  GERD with a history of esophagitis and peptic stricture.  On PPI 3.  Multiple significant medical problems including cerebrovascular disease with prior TIA status post cerebral artery intervention 4.  Advanced age     PLAN:   48.  Upper endoscopy.  Patient is high risk given his age and comorbidities.  Patient will remain ON PLAVIX for this exam.   No therapeutics planned initially.The nature of the procedure, as well as the risks, benefits, and alternatives were carefully and thoroughly reviewed with the patient. Ample time for discussion and questions allowed. The patient understood, was satisfied, and agreed to proceed. 2.  Continue PPI 3.  Ongoing general medical care with Dr. Reynaldo Minium

## 2022-08-31 NOTE — Patient Instructions (Signed)
YOU HAD AN ENDOSCOPIC PROCEDURE TODAY AT THE [facility]:   Refer to the procedure report that was given to you for any specific questions about what was found during the examination.  If the procedure report does not answer your questions, please call your gastroenterologist to clarify.  If you requested that your care partner not be given the details of your procedure findings, then the procedure report has been included in a sealed envelope for you to review at your convenience later.  YOU SHOULD EXPECT: Some feelings of bloating in the abdomen. Passage of more gas than usual.  Walking can help get rid of the air that was put into your GI tract during the procedure and reduce the bloating. If you had a lower endoscopy (such as a colonoscopy or flexible sigmoidoscopy) you may notice spotting of blood in your stool or on the toilet paper. If you underwent a bowel prep for your procedure, you may not have a normal bowel movement for a few days.  Please Note:  You might notice some irritation and congestion in your nose or some drainage.  This is from the oxygen used during your procedure.  There is no need for concern and it should clear up in a day or so.  SYMPTOMS TO REPORT IMMEDIATELY:   Following upper endoscopy (EGD)  Vomiting of blood or coffee ground material  New chest pain or pain under the shoulder blades  Painful or persistently difficult swallowing  New shortness of breath  Fever of 100F or higher  Black, tarry-looking stools  For urgent or emergent issues, a gastroenterologist can be reached at any hour by calling [phone]. Do not use MyChart messaging for urgent concerns.    DIET:  We do recommend a small meal at first, but then you may proceed to your regular diet.  Drink plenty of fluids but you should avoid alcoholic beverages for 24 hours.  ACTIVITY:  You should plan to take it easy for the rest of today and you should NOT DRIVE or use heavy machinery  until tomorrow (because of the sedation medicines used during the test).    FOLLOW UP: Our staff will call the number listed on your records the next business day following your procedure.  We will call around 7:15- 8:00 am to check on you and address any questions or concerns that you may have regarding the information given to you following your procedure. If we do not reach you, we will leave a message.     If any biopsies were taken you will be contacted by phone or by letter within the next 1-3 weeks.  Please call us at [phone] if you have not heard about the biopsies in 3 weeks.    SIGNATURES/CONFIDENTIALITY: You and/or your care partner have signed paperwork which will be entered into your electronic medical record.  These signatures attest to the fact that that the information above on your After Visit Summary has been reviewed and is understood.  Full responsibility of the confidentiality of this discharge information lies with you and/or your care-partner. 

## 2022-08-31 NOTE — Op Note (Signed)
[facility] Patient Name: Zerrick Hanssen Procedure Date: 08/31/2022 10:24 AM MRN: [id number] Endoscopist: Docia Chuck. Henrene Pastor , MD, [id number] Age: 87 Referring MD:  Date of Birth: 1935-12-24 Gender: Male Account #: [id number] Procedure:                Upper GI endoscopy Indications:              Dysphagia (cough elicited with eating certain food                            items, dry items), Esophageal reflux Medicines:                Monitored Anesthesia Care Procedure:                Pre-Anesthesia Assessment:                           - Prior to the procedure, a History and Physical                            was performed, and patient medications and                            allergies were reviewed. The patient's tolerance of                            previous anesthesia was also reviewed. The risks                            and benefits of the procedure and the sedation                            options and risks were discussed with the patient.                            All questions were answered, and informed consent                            was obtained. Prior Anticoagulants: The patient has                            taken Plavix (clopidogrel), last dose was day of                            procedure. ASA Grade Assessment: III - A patient                            with severe systemic disease. After reviewing the                            risks and benefits, the patient was deemed in                            satisfactory condition to undergo the procedure.  After obtaining informed consent, the endoscope was                            passed under direct vision. Throughout the                            procedure, the patient's blood pressure, pulse, and                            oxygen saturations were monitored continuously. The                            Endoscope was introduced through the mouth, and                             advanced to the second part of duodenum. The upper                            GI endoscopy was accomplished without difficulty.                            The patient tolerated the procedure well. Scope In: Scope Out: Findings:                 The esophagus was normal.                           The stomach was normal.                           The examined duodenum was normal.                           The cardia and gastric fundus were normal on                            retroflexion. Complications:            No immediate complications. Estimated Blood Loss:     Estimated blood loss: none. Impression:               - Normal esophagus.                           - Normal stomach.                           - Normal examined duodenum.                           - No specimens collected. Recommendation:           - Patient has a contact number available for                            emergencies. The signs and symptoms of potential  delayed complications were discussed with the                            patient. Return to normal activities tomorrow.                            Written discharge instructions were provided to the                            patient.                           - Resume previous diet. Consume plenty of water                            with dry food items                           - Continue present medications. Docia Chuck. Henrene Pastor, MD 08/31/2022 10:38:13 AM This report has been signed electronically.

## 2022-09-01 ENCOUNTER — Telehealth: Payer: Self-pay | Admitting: *Deleted

## 2022-09-01 NOTE — Telephone Encounter (Signed)
  Follow up Call-     08/31/2022    9:26 AM  Call back number  Post procedure Call Back phone  # [phone]  Permission to leave phone message Yes     Patient questions:  Do you have a fever, pain , or abdominal swelling? No. Pain Score  0 *  Have you tolerated food without any problems? Yes.    Have you been able to return to your normal activities? Yes.    Do you have any questions about your discharge instructions: Diet   No. Medications  No. Follow up visit  No.  Do you have questions or concerns about your Care? No.  Actions: * If pain score is 4 or above: No action needed, pain <4.

## 2022-11-09 DIAGNOSIS — R2681 Unsteadiness on feet: Secondary | ICD-10-CM | POA: Diagnosis not present

## 2022-11-09 DIAGNOSIS — I10 Essential (primary) hypertension: Secondary | ICD-10-CM | POA: Diagnosis not present

## 2022-11-09 DIAGNOSIS — E039 Hypothyroidism, unspecified: Secondary | ICD-10-CM | POA: Diagnosis not present

## 2022-11-09 DIAGNOSIS — E663 Overweight: Secondary | ICD-10-CM | POA: Diagnosis not present

## 2022-11-09 DIAGNOSIS — E1169 Type 2 diabetes mellitus with other specified complication: Secondary | ICD-10-CM | POA: Diagnosis not present

## 2022-11-23 DIAGNOSIS — L84 Corns and callosities: Secondary | ICD-10-CM | POA: Diagnosis not present

## 2022-11-23 DIAGNOSIS — I739 Peripheral vascular disease, unspecified: Secondary | ICD-10-CM | POA: Diagnosis not present

## 2022-11-23 DIAGNOSIS — L603 Nail dystrophy: Secondary | ICD-10-CM | POA: Diagnosis not present

## 2022-12-03 NOTE — Progress Notes (Signed)
Office Note    HPI: Nathaniel Mcclain is a 87 y.o. (14-Feb-1936) male presenting in postoperative follow-up status post right-sided transcarotid artery revascularization.  Patient underwent right-sided TCAR for symptomatic ICA stenosis with placement of 10 x 40 mm stent.  Deficit at the time included left upper extremity numbness, the weakness had resolved preoperatively.  On exam today, Nathaniel Mcclain was doing well.   He denied symptoms of TIA, amaurosis, stroke.  No wound healing concerns..  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  His wife continues to struggle with A-fib, and has had 5 failed ablations..  He continues to be compliant with aspirin, high intensity statin   Past Medical History:  Diagnosis Date   Arthritis    Colon polyps    CVA (cerebral vascular accident) (HCC) 12/2021   Diabetes (HCC)    pt denies, and does not take any medications   GERD (gastroesophageal reflux disease)    Hepatitis    hx of Hepatitis A   Hyperlipidemia    Hypertension    Hypothyroid    Osteoarthritis    TIA (transient ischemic attack) 1980    Past Surgical History:  Procedure Laterality Date   THYROIDECTOMY  2008   TRANSCAROTID ARTERY REVASCULARIZATION  Right 12/18/2021   Procedure: Right Transcarotid Artery Revascularization;  Surgeon: Victorino Sparrow, MD;  Location: [facility] OR;  Service: Vascular;  Laterality: Right;    Social History   Socioeconomic History   Marital status: Married    Spouse name: Not on file   Number of children: 1   Years of education: Not on file   Highest education level: Not on file  Occupational History   Not on file  Tobacco Use   Smoking status: Never   Smokeless tobacco: Never  Vaping Use   Vaping Use: Never used  Substance and Sexual Activity   Alcohol use: No    Alcohol/week: 0.0 standard drinks of alcohol   Drug use: No   Sexual activity: Not on file  Other Topics Concern   Not on file  Social History Narrative   Not on file   Social  Determinants of Health   Financial Resource Strain: Not on file  Food Insecurity: Not on file  Transportation Needs: Not on file  Physical Activity: Not on file  Stress: Not on file  Social Connections: Not on file  Intimate Partner Violence: Not on file   Family History  Problem Relation Age of Onset   Diabetes Mother    Heart disease Mother    Colon cancer Neg Hx    Esophageal cancer Neg Hx    Rectal cancer Neg Hx    Stomach cancer Neg Hx     Current Outpatient Medications  Medication Sig Dispense Refill   amLODipine (NORVASC) 10 MG tablet Take 10 mg by mouth daily.     aspirin 81 MG chewable tablet Chew 1 tablet (81 mg total) by mouth daily. 30 tablet 0   benazepril (LOTENSIN) 20 MG tablet Take 0.5 tablets (10 mg total) by mouth 2 (two) times daily. (Patient taking differently: Take 20 mg by mouth 2 (two) times daily.)     bisacodyl (DULCOLAX) 5 MG EC tablet Take 10 mg by mouth every other day.     clopidogrel (PLAVIX) 75 MG tablet Take 1 tablet (75 mg total) by mouth daily. 30 tablet 0   ezetimibe (ZETIA) 10 MG tablet Take 1 tablet (10 mg total) by mouth daily. 30 tablet 0   levothyroxine (SYNTHROID)  150 MCG tablet Take 150 mcg by mouth every morning.     Omega-3 Fatty Acids (FISH OIL PO) Take by mouth.     oxybutynin (DITROPAN-XL) 5 MG 24 hr tablet Take 5 mg by mouth daily. (Patient not taking: Reported on 08/31/2022)     pantoprazole (PROTONIX) 40 MG tablet Take 40 mg by mouth daily.     rosuvastatin (CRESTOR) 40 MG tablet Take 1 tablet (40 mg total) by mouth daily. 30 tablet 0   senna (SENOKOT) 8.6 MG TABS tablet Take 1 tablet by mouth.     tamsulosin (FLOMAX) 0.4 MG CAPS capsule Take 0.4 mg by mouth daily.     No current facility-administered medications for this visit.    Allergies  Allergen Reactions   Codeine     Other reaction(s): Unknown   Prednisone     Other reaction(s): hives     REVIEW OF SYSTEMS:  [X]  denotes positive finding, [ ]  denotes negative  finding Cardiac  Comments:  Chest pain or chest pressure:    Shortness of breath upon exertion:    Short of breath when lying flat:    Irregular heart rhythm:        Vascular    Pain in calf, thigh, or hip brought on by ambulation:    Pain in feet at night that wakes you up from your sleep:     Blood clot in your veins:    Leg swelling:         Pulmonary    Oxygen at home:    Productive cough:     Wheezing:         Neurologic    Sudden weakness in arms or legs:     Sudden numbness in arms or legs:     Sudden onset of difficulty speaking or slurred speech:    Temporary loss of vision in one eye:     Problems with dizziness:         Gastrointestinal    Blood in stool:     Vomited blood:         Genitourinary    Burning when urinating:     Blood in urine:        Psychiatric    Major depression:         Hematologic    Bleeding problems:    Problems with blood clotting too easily:        Skin    Rashes or ulcers:        Constitutional    Fever or chills:      PHYSICAL EXAMINATION:  There were no vitals filed for this visit.   General:  WDWN in NAD; vital signs documented above Gait: Not observed HENT: WNL, normocephalic, right neck incision site healed well Pulmonary: normal non-labored breathing  Cardiac: regular HR Abdomen: soft, NT, no masses Skin: without rashes Vascular Exam/Pulses:  Right Left  Radial 2+ (normal) 2+ (normal)  Ulnar 2+ (normal) 2+ (normal)                   Extremities: without ischemic changes, without Gangrene , without cellulitis; without open wounds;  Musculoskeletal: no muscle wasting or atrophy  Neurologic: A&O X 3;  No focal weakness or paresthesias are detected Psychiatric:  The pt has Normal affect.   Non-Invasive Vascular Imaging:   Right Carotid Findings:  +----------+--------+--------+--------+------------------+-------------+           PSV cm/sEDV cm/sStenosisPlaque DescriptionComments        +----------+--------+--------+--------+------------------+-------------+  CCA Prox  77      13                                               +----------+--------+--------+--------+------------------+-------------+  CCA Mid   60      11                                               +----------+--------+--------+--------+------------------+-------------+  CCA Distal59      14              heterogenous      stent inflow   +----------+--------+--------+--------+------------------+-------------+  ICA Prox                                            stent          +----------+--------+--------+--------+------------------+-------------+  ICA Mid   98      19                                stent outflow  +----------+--------+--------+--------+------------------+-------------+  ICA Distal85      18                                               +----------+--------+--------+--------+------------------+-------------+  ECA      350     26      >50%                                     +----------+--------+--------+--------+------------------+-------------+   +----------+--------+-------+----------+-------------------+           PSV cm/sEDV cmsDescribe  Arm Pressure (mmHG)  +----------+--------+-------+----------+-------------------+  [id number]     0      monophasic                     +----------+--------+-------+----------+-------------------+   +---------+--------+--+--------+--+---------+  VertebralPSV cm/s54EDV cm/s12Antegrade  +---------+--------+--+--------+--+---------+   Right Stent(s):  +---------------+---+--++++  Prox to Stent  59 14  +---------------+---+--++++  Proximal Stent 87 17  +---------------+---+--++++  Mid Stent      11223  +---------------+---+--++++  Distal Stent   10421  +---------------+---+--++++  Distal to Stent98 19  +---------------+---+--++++     Left  Carotid Findings:  +----------+--------+--------+--------+------------------+--------+           PSV cm/sEDV cm/sStenosisPlaque DescriptionComments  +----------+--------+--------+--------+------------------+--------+  CCA Prox  97      15                                          +----------+--------+--------+--------+------------------+--------+  CCA Mid   93      14              heterogenous                +----------+--------+--------+--------+------------------+--------+  CCA Distal93      14              heterogenous                +----------+--------+--------+--------+------------------+--------+  ICA Prox  80      23      1-39%   heterogenous                +----------+--------+--------+--------+------------------+--------+  ICA Mid   81      20                                          +----------+--------+--------+--------+------------------+--------+  ICA Distal75      23                                          +----------+--------+--------+--------+------------------+--------+  ECA      92      10                                          +----------+--------+--------+--------+------------------+--------+   +----------+--------+--------+----------------+-------------------+           PSV cm/sEDV cm/sDescribe        Arm Pressure (mmHG)  +----------+--------+--------+----------------+-------------------+  Subclavian127            Multiphasic, WNL                     +----------+--------+--------+----------------+-------------------+   +---------+--------+--+--------+-+---------+  VertebralPSV cm/s18EDV cm/s6Antegrade  +---------+--------+--+--------+-+---------+     ASSESSMENT/PLAN: Nathaniel Mcclain is a 87 y.o. male presenting status post 12/18/2021 right-sided TCAR for symptomatic ICA stenosis.  Bilateral carotid duplex ultrasound reviewed demonstrating no stenosis within the stent, mild stenosis in the  contralateral ICA.  I plan to see Nathaniel Mcclain back in the office in 12 months with repeat duplex ultrasound.  I asked that he continue his current medication regimen and call should any questions or concerns arise.   Victorino Sparrow, MD [facility] [phone]

## 2022-12-04 ENCOUNTER — Ambulatory Visit (INDEPENDENT_AMBULATORY_CARE_PROVIDER_SITE_OTHER): Payer: Medicare Other | Admitting: Vascular Surgery

## 2022-12-04 ENCOUNTER — Ambulatory Visit (HOSPITAL_COMMUNITY)
Admission: RE | Admit: 2022-12-04 | Discharge: 2022-12-04 | Disposition: A | Discharge status: Home / Self Care | Payer: Medicare Other | Source: Ambulatory Visit | Attending: Vascular Surgery | Admitting: Vascular Surgery

## 2022-12-04 ENCOUNTER — Encounter: Payer: Self-pay | Admitting: Vascular Surgery

## 2022-12-04 VITALS — BP 151/79 | HR 63 | Temp 97.9°F | Resp 20 | Ht 72.0 in | Wt 179.0 lb

## 2022-12-04 DIAGNOSIS — Z9862 Peripheral vascular angioplasty status: Secondary | ICD-10-CM

## 2022-12-04 DIAGNOSIS — I6529 Occlusion and stenosis of unspecified carotid artery: Secondary | ICD-10-CM

## 2022-12-14 ENCOUNTER — Other Ambulatory Visit: Payer: Self-pay

## 2022-12-14 DIAGNOSIS — I6529 Occlusion and stenosis of unspecified carotid artery: Secondary | ICD-10-CM

## 2023-03-04 DIAGNOSIS — I739 Peripheral vascular disease, unspecified: Secondary | ICD-10-CM | POA: Diagnosis not present

## 2023-03-04 DIAGNOSIS — L603 Nail dystrophy: Secondary | ICD-10-CM | POA: Diagnosis not present

## 2023-03-04 DIAGNOSIS — L84 Corns and callosities: Secondary | ICD-10-CM | POA: Diagnosis not present

## 2023-05-12 DIAGNOSIS — E1169 Type 2 diabetes mellitus with other specified complication: Secondary | ICD-10-CM | POA: Diagnosis not present

## 2023-05-12 DIAGNOSIS — I10 Essential (primary) hypertension: Secondary | ICD-10-CM | POA: Diagnosis not present

## 2023-05-12 DIAGNOSIS — M1990 Unspecified osteoarthritis, unspecified site: Secondary | ICD-10-CM | POA: Diagnosis not present

## 2023-05-12 DIAGNOSIS — R2681 Unsteadiness on feet: Secondary | ICD-10-CM | POA: Diagnosis not present

## 2023-05-12 DIAGNOSIS — E663 Overweight: Secondary | ICD-10-CM | POA: Diagnosis not present

## 2023-05-12 DIAGNOSIS — Z23 Encounter for immunization: Secondary | ICD-10-CM | POA: Diagnosis not present

## 2023-05-12 DIAGNOSIS — E785 Hyperlipidemia, unspecified: Secondary | ICD-10-CM | POA: Diagnosis not present

## 2023-05-12 DIAGNOSIS — I7090 Unspecified atherosclerosis: Secondary | ICD-10-CM | POA: Diagnosis not present

## 2023-06-04 IMAGING — MR MR HEAD WO/W CM
15 series · 48 of 48 positions shown · IV contrast (gadavist)
Comparison: Head CT 12/05/2021

CLINICAL DATA: Bent over several days ago and struck forehead on
table. Left hand weakness.

EXAM:
MRI HEAD WITHOUT AND WITH CONTRAST
TECHNIQUE: Multiplanar, multiecho pulse sequences of the brain and surrounding
structures were obtained without and with intravenous contrast.
CONTRAST:  8mL GADAVIST GADOBUTROL 1 MMOL/ML IV SOLN

[Series 5: DWI · axial · 3.0mm · 1.36mm/px · z∈[-21,+124]mm · 7 of 104 slices shown (1 of 2)]
[im 1/104]
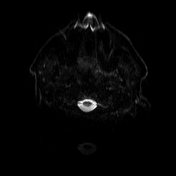
[im 18/104]
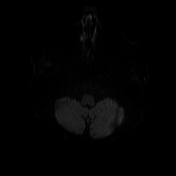
[im 35/104]
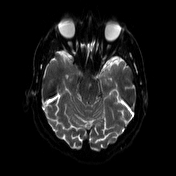
[im 52/104]
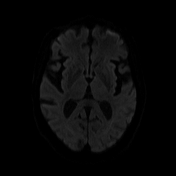
[im 69/104]
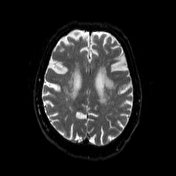
[im 86/104]
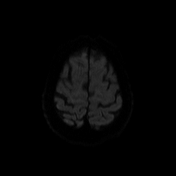
[im 104/104]
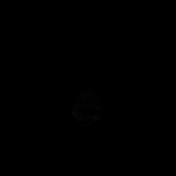

[Series 6: DWI · axial · 3.0mm · 1.36mm/px · z∈[-21,+124]mm · 3 of 52 slices shown (2 of 2)]
[im 1/52]
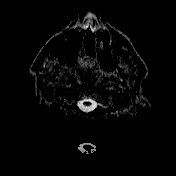
[im 26/52]
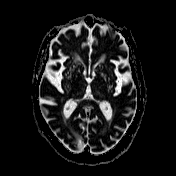
[im 52/52]
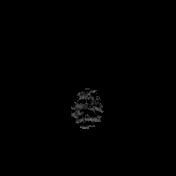

[Series 7: T1 · sagittal · 5.0mm · 0.75mm/px · 1 of 24 slices shown (1 of 4)]
[im 1/24]
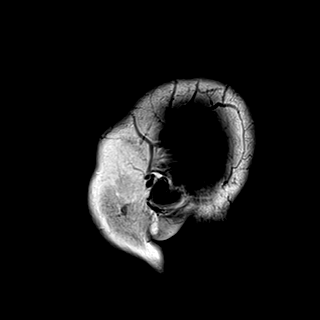

[Series 8: T2 · axial · 5.0mm · 0.62mm/px · 1 of 24 slices shown]
[im 1/24]
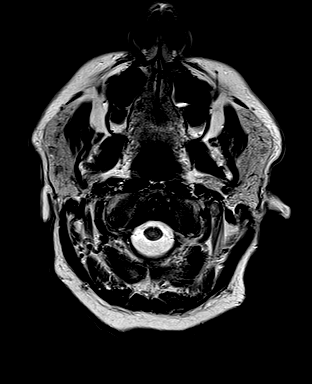

[Series 9: swi_images · axial · 3.0mm · 0.75mm/px · z∈[-19,+126]mm · 3 of 52 slices shown]
[im 1/52]
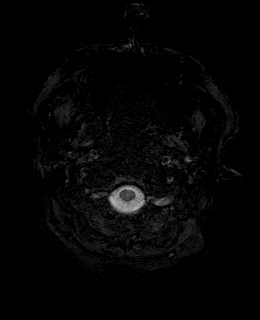
[im 26/52]
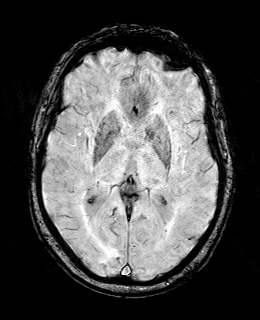
[im 52/52]
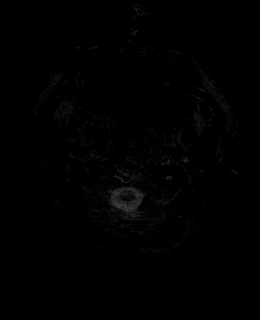

[Series 11: FLAIR · axial · 3.0mm · 0.75mm/px · z∈[-19,+126]mm · 3 of 52 slices shown]
[im 1/52]
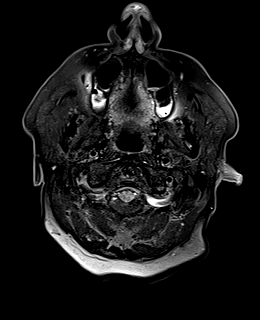
[im 26/52]
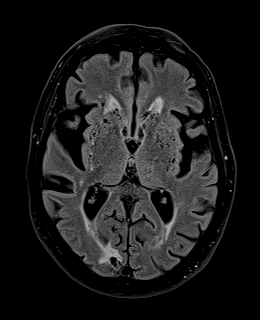
[im 52/52]
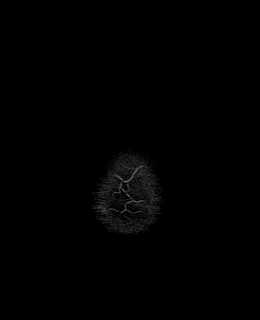

[Series 12: T1 · axial · 1.0mm · 0.94mm/px · z∈[-13,+122]mm · 8 of 144 slices shown (2 of 4)]
[im 1/144]
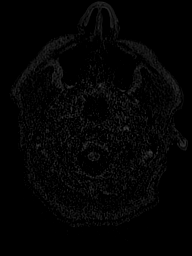
[im 21/144]
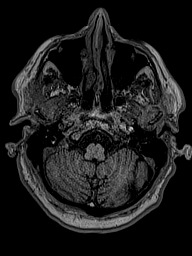
[im 41/144]
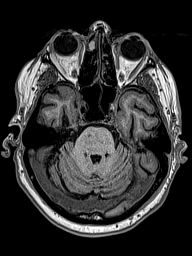
[im 62/144]
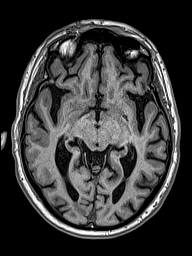
[im 82/144]
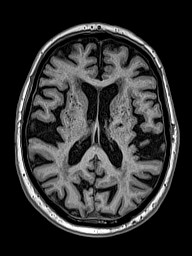
[im 103/144]
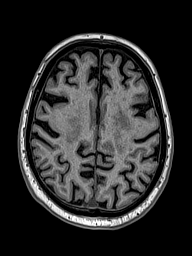
[im 123/144]
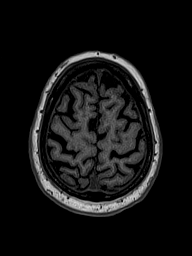
[im 144/144]
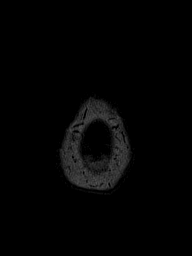

[Series 13: cor dwi_tracew · coronal · 5.0mm · 1.53mm/px · 3 of 52 slices shown]
[im 1/52]
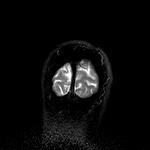
[im 26/52]
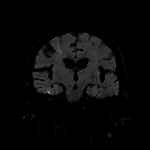
[im 52/52]
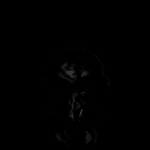

[Series 14: cor dwi_adc · coronal · 5.0mm · 1.53mm/px · 2 of 26 slices shown]
[im 1/26]
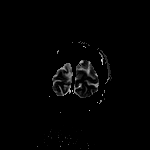
[im 26/26]
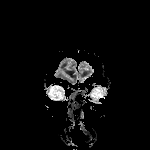

[Series 15: T2 post-contrast · coronal · 5.0mm · 0.57mm/px · 2 of 28 slices shown]
[im 1/28]
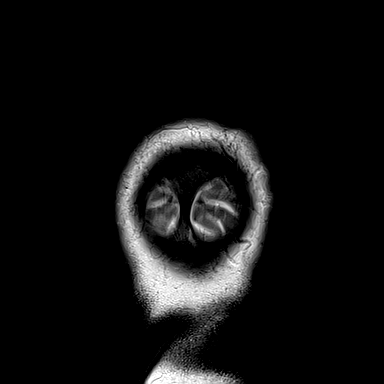
[im 28/28]
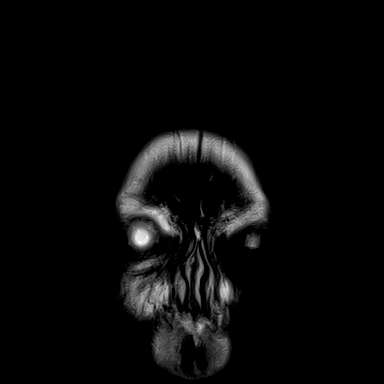

[Series 16: T1 post-contrast · axial · 1.0mm · 0.94mm/px · z∈[-13,+122]mm · 8 of 144 slices shown (1 of 3)]
[im 1/144]
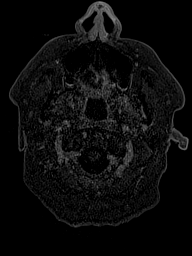
[im 21/144]
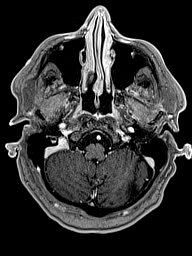
[im 41/144]
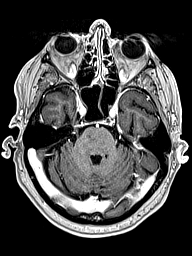
[im 62/144]
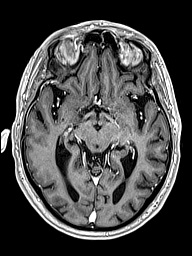
[im 82/144]
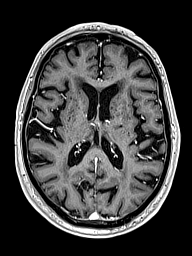
[im 103/144]
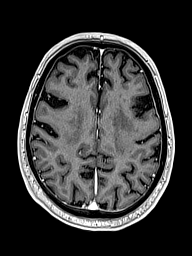
[im 123/144]
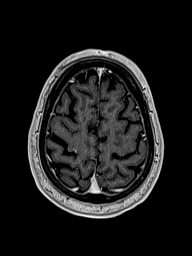
[im 144/144]
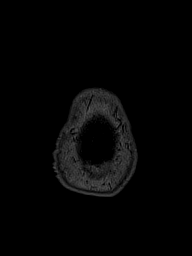

[Series 17: T1 · sagittal · 4.0mm · 0.94mm/px · 2 of 30 slices shown (3 of 4)]
[im 1/30]
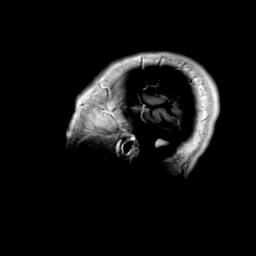
[im 30/30]
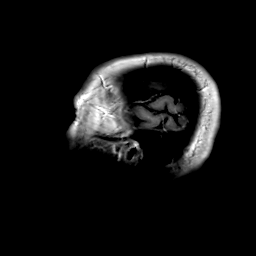

[Series 18: T1 · coronal · 4.0mm · 0.94mm/px · 2 of 42 slices shown (4 of 4)]
[im 1/42]
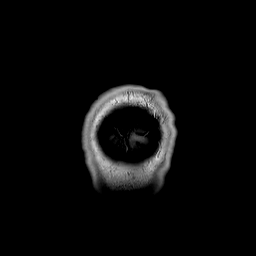
[im 42/42]
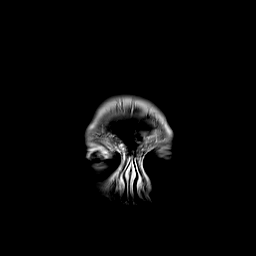

[Series 19: T1 post-contrast · coronal · 5.0mm · 0.43mm/px · 2 of 28 slices shown (2 of 3)]
[im 1/28]
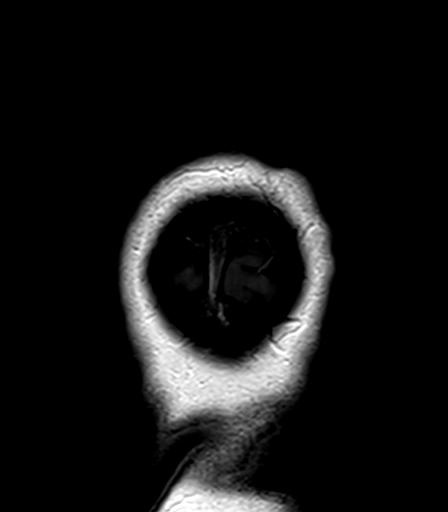
[im 28/28]
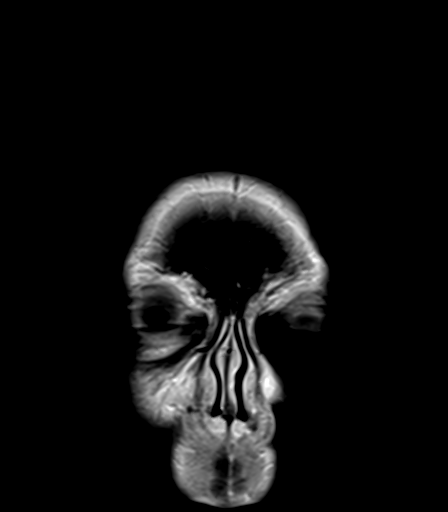

[Series 20: T1 post-contrast · sagittal · 5.0mm · 0.75mm/px · 1 of 24 slices shown (3 of 3)]
[im 1/24]
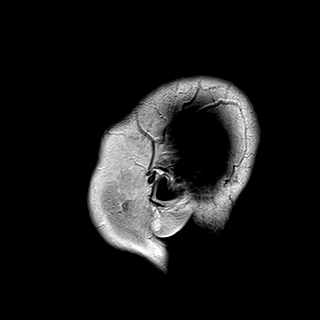

[48 of 48 positions shown; findings below may reference images not displayed]

FINDINGS: Brain: Diffusion imaging shows a cluster of small acute infarctions
affecting the right posterior frontal cortex, possibly the
precentral gyrus, and a few foci of the deep white matter in the
forceps major region on the right. No large confluent infarction.
There chronic small-vessel ischemic changes of the pons. No focal
cerebellar insult. Cerebral hemispheres show old small vessel
infarctions of the thalami, basal ganglia and throughout the
cerebral hemispheric white matter. There is an old right posterior
parietooccipital junction cortical infarction. No mass lesion,
hemorrhage, hydrocephalus or extra-axial collection. After contrast
administration, there is minimal enhancement at the site of the
right posterior frontal infarctions.

Vascular: Major vessels at the base of the brain show flow.

Skull and upper cervical spine: Negative

Sinuses/Orbits: Clear/normal

Other: None
IMPRESSION: Few small acute infarctions at the right posterior frontal vertex,
probably affecting the pre central gyrus. No mass effect or
hemorrhage. Minimal enhancement at those sites, often seen in acute
to subacute infarctions. Few small acute infarctions also affecting
the right parietal deep white matter in the forceps major region. No
large infarction.

Extensive chronic small-vessel ischemic changes elsewhere throughout
the brain as outlined above. Old infarction at the right posterior
parietal/occipital junction.

These results will be called to the ordering clinician or
representative by the Radiologist Assistant, and communication
documented in the PACS or [REDACTED].

## 2023-06-04 IMAGING — CT CT ANGIO NECK
3 of 17 series · 5 of 35 positions shown · non-contrast
Comparison: Prior MRI from earlier the same day.

CLINICAL DATA: Follow-up examination for acute stroke.

EXAM:
CT ANGIOGRAPHY HEAD AND NECK
TECHNIQUE: Multidetector CT imaging of the head and neck was performed using
the standard protocol during bolus administration of intravenous
contrast. Multiplanar CT image reconstructions and MIPs were
obtained to evaluate the vascular anatomy. Carotid stenosis
measurements (when applicable) are obtained utilizing NASCET
criteria, using the distal internal carotid diameter as the
denominator.

[Series 9: ax thin · axial · 0.56mm/px · z∈[-271,-146]mm · 2 of 375 slices shown (1 of 2)]
[im 125/375  soft-tissue]
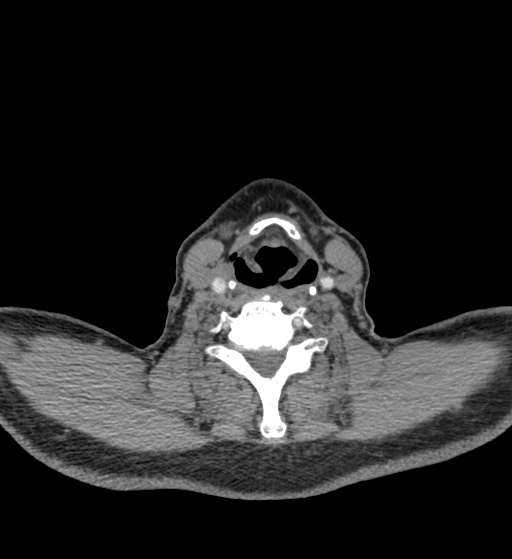
[im 250/375  bone]
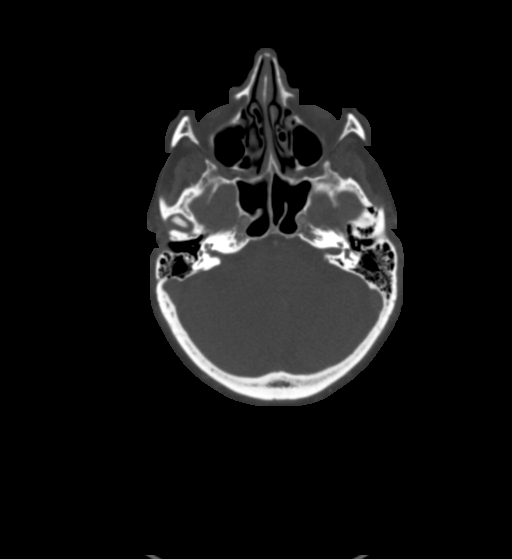

[Series 11: sagittal thin · sagittal · 0.61mm/px · 1 of 286 slices shown]
[im 146/286  soft-tissue]
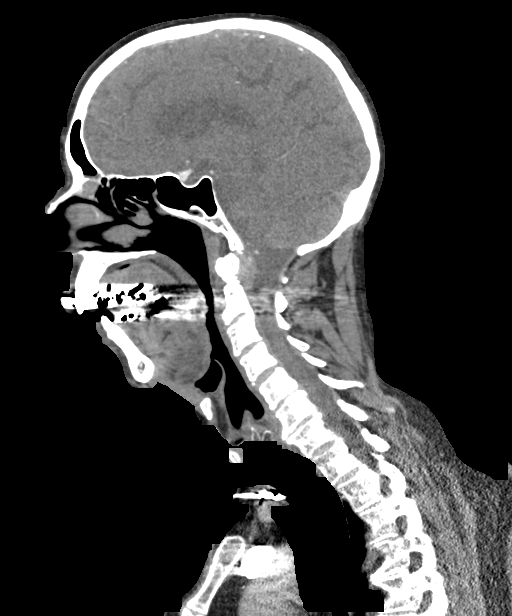

[Series 15: ax thin · axial · 0.52mm/px · z∈[-271,-149]mm · 2 of 368 slices shown (2 of 2)]
[im 123/368  soft-tissue]
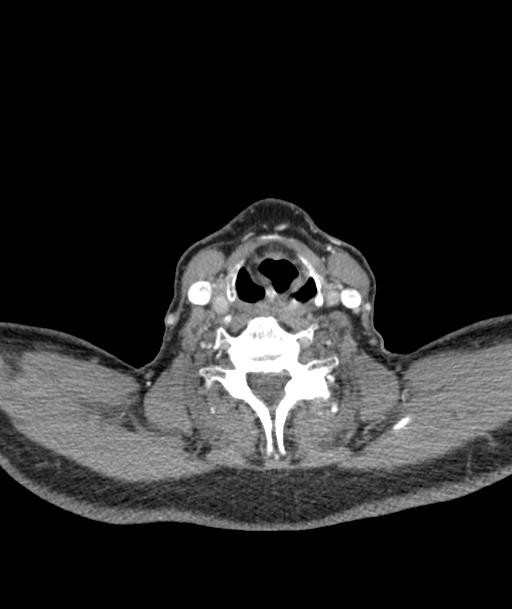
[im 245/368  soft-tissue]
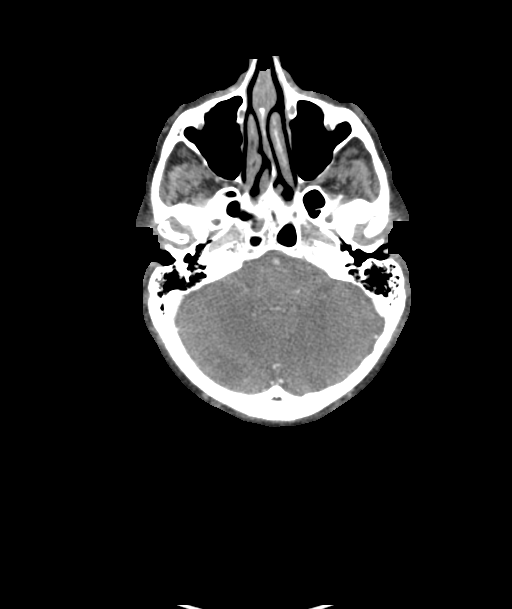

[5 of 35 positions shown; findings below may reference images not displayed]

RADIATION DOSE REDUCTION: This exam was performed according to the
departmental dose-optimization program which includes automated
exposure control, adjustment of the mA and/or kV according to
patient size and/or use of iterative reconstruction technique.

CONTRAST:  75mL OMNIPAQUE IOHEXOL 350 MG/ML SOLN
FINDINGS: CT HEAD FINDINGS

Brain: Age-related cerebral atrophy with moderate chronic
microvascular ischemic disease. Remote lacunar infarct present at
the left basal ganglia. Small remote right PCA distribution infarct
noted.

Previously identified small ischemic infarcts involving the right
cerebral hemisphere grossly stable. No mass effect or associated
hemorrhage. No other acute large vessel territory infarct or
intracranial hemorrhage. No midline shift or hydrocephalus. No
extra-axial fluid collection. No mass lesion.

Vascular: No hyperdense vessel.

Skull: Scalp soft tissues within normal limits.  Calvarium intact.

Sinuses: Scattered mucosal thickening noted about the ethmoidal air
cells. Paranasal sinuses are otherwise clear. No mastoid effusion.

Orbits: Globes orbital soft tissues demonstrate no acute finding.

Review of the MIP images confirms the above findings

CTA NECK FINDINGS

Aortic arch: Examination mildly degraded by timing of the contrast
bolus and streak artifact from surgical clips and dental amalgam.

Visualized aortic arch normal caliber with standard three-vessel
branching pattern. Mild-to-moderate atheromatous change about the
arch and origin the great vessels without hemodynamically
significant stenosis.

Right carotid system: Right CCA patent without stenosis. Extensive
bulky calcified plaque about the right carotid bulb/proximal right
ICA with associated severe stenosis of up to approximately 80% by
NASCET criteria. Right ICA patent distally without stenosis or
dissection.

Left carotid system: Left CCA patent from its origin to the
bifurcation. Eccentric bulky calcified plaque at the left carotid
bulb/proximal left ICA with associated stenosis of up to
approximately 50% by NASCET criteria. Left ICA patent distally
without stenosis or dissection.

Vertebral arteries: Both vertebral arteries arise from subclavian
arteries. No proximal subclavian artery stenosis. Calcified plaque
at the origins of both vertebral arteries with associated severe
ostial stenoses, slightly worse on the left. Vertebral arteries
otherwise patent distally without stenosis or dissection.

Skeleton: No discrete or worrisome osseous lesions. Mild-to-moderate
spondylosis present at C5-6 and C6-7. Prominent osteoarthritic
changes present about the C1-2 articulation.

Other neck: No other acute soft tissue abnormality within the neck.
Sequelae of prior thyroidectomy.

Upper chest: Visualized upper chest demonstrates no acute finding.

Review of the MIP images confirms the above findings

CTA HEAD FINDINGS

Anterior circulation: Petrous segments patent bilaterally. Mild for
age atheromatous change within the carotid siphons without
significant stenosis. A1 segments, anterior communicating complex
common anterior cerebral arteries patent without stenosis or other
abnormality. No M1 stenosis or occlusion. No proximal MCA branch
occlusion. Distal MCA branches well perfused and symmetric.

Posterior circulation: Both V4 segments widely patent. Left
vertebral artery dominant. Both PICA patent at their origins.
Basilar patent without stenosis. Superior cerebellar arteries patent
bilaterally. Both PCAs primarily supplied via the basilar
atheromatous irregularity seen involving the right greater than left
PCAs bilaterally. Associated multifocal moderate to severe stenoses
seen involving the right P2 segment (series 12, image 56). No
proximal high-grade stenosis about the contralateral left PCA. PCAs
remain grossly patent to their distal aspects.

Venous sinuses: Grossly patent allowing for timing the contrast
bolus.

Anatomic variants: None significant.  No aneurysm.

Review of the MIP images confirms the above findings
IMPRESSION: 1. Negative CTA for large vessel occlusion or other emergent
finding.
2. Extensive bulky calcified plaque about the right carotid
bulb/proximal right ICA with associated severe stenosis of up to 80%
by NASCET criteria.
3. 50% atheromatous stenosis about the left carotid bifurcation.
4. Atheromatous plaque at the origins of both vertebral arteries
with associated severe ostial stenoses.
5. Moderate to advanced atheromatous irregularity involving the
right PCA with associated moderate to severe multifocal right P2
stenoses.

## 2023-06-04 IMAGING — CT CT ANGIO HEAD
1 of 21 series · 3 of 35 positions shown · non-contrast
Comparison: Prior MRI from earlier the same day.

CLINICAL DATA: Follow-up examination for acute stroke.

EXAM:
CT ANGIOGRAPHY HEAD AND NECK
TECHNIQUE: Multidetector CT imaging of the head and neck was performed using
the standard protocol during bolus administration of intravenous
contrast. Multiplanar CT image reconstructions and MIPs were
obtained to evaluate the vascular anatomy. Carotid stenosis
measurements (when applicable) are obtained utilizing NASCET
criteria, using the distal internal carotid diameter as the
denominator.

[Series 523: cta head neck thins · axial · 0.56mm/px · z∈[-394,-24]mm · 3 of 741 slices shown]
[im 1/741  soft-tissue]
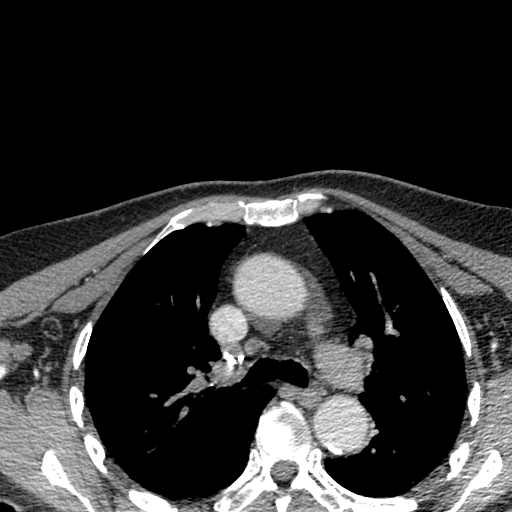
[im 371/741  bone]
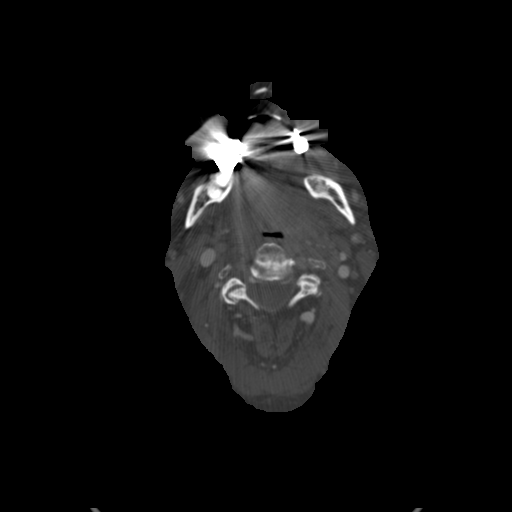
[im 741/741  soft-tissue]
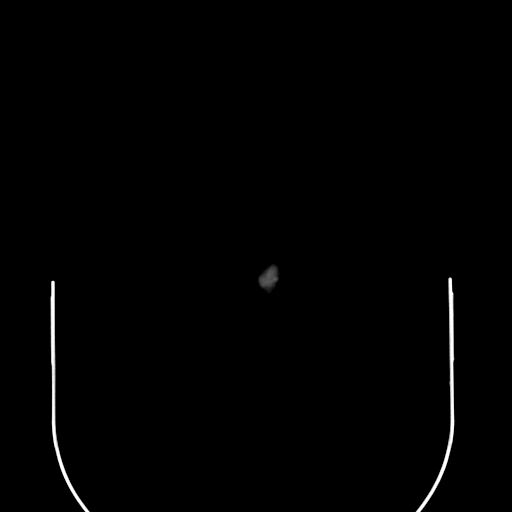

[3 of 35 positions shown; findings below may reference images not displayed]

RADIATION DOSE REDUCTION: This exam was performed according to the
departmental dose-optimization program which includes automated
exposure control, adjustment of the mA and/or kV according to
patient size and/or use of iterative reconstruction technique.

CONTRAST:  75mL OMNIPAQUE IOHEXOL 350 MG/ML SOLN
FINDINGS: CT HEAD FINDINGS

Brain: Age-related cerebral atrophy with moderate chronic
microvascular ischemic disease. Remote lacunar infarct present at
the left basal ganglia. Small remote right PCA distribution infarct
noted.

Previously identified small ischemic infarcts involving the right
cerebral hemisphere grossly stable. No mass effect or associated
hemorrhage. No other acute large vessel territory infarct or
intracranial hemorrhage. No midline shift or hydrocephalus. No
extra-axial fluid collection. No mass lesion.

Vascular: No hyperdense vessel.

Skull: Scalp soft tissues within normal limits.  Calvarium intact.

Sinuses: Scattered mucosal thickening noted about the ethmoidal air
cells. Paranasal sinuses are otherwise clear. No mastoid effusion.

Orbits: Globes orbital soft tissues demonstrate no acute finding.

Review of the MIP images confirms the above findings

CTA NECK FINDINGS

Aortic arch: Examination mildly degraded by timing of the contrast
bolus and streak artifact from surgical clips and dental amalgam.

Visualized aortic arch normal caliber with standard three-vessel
branching pattern. Mild-to-moderate atheromatous change about the
arch and origin the great vessels without hemodynamically
significant stenosis.

Right carotid system: Right CCA patent without stenosis. Extensive
bulky calcified plaque about the right carotid bulb/proximal right
ICA with associated severe stenosis of up to approximately 80% by
NASCET criteria. Right ICA patent distally without stenosis or
dissection.

Left carotid system: Left CCA patent from its origin to the
bifurcation. Eccentric bulky calcified plaque at the left carotid
bulb/proximal left ICA with associated stenosis of up to
approximately 50% by NASCET criteria. Left ICA patent distally
without stenosis or dissection.

Vertebral arteries: Both vertebral arteries arise from subclavian
arteries. No proximal subclavian artery stenosis. Calcified plaque
at the origins of both vertebral arteries with associated severe
ostial stenoses, slightly worse on the left. Vertebral arteries
otherwise patent distally without stenosis or dissection.

Skeleton: No discrete or worrisome osseous lesions. Mild-to-moderate
spondylosis present at C5-6 and C6-7. Prominent osteoarthritic
changes present about the C1-2 articulation.

Other neck: No other acute soft tissue abnormality within the neck.
Sequelae of prior thyroidectomy.

Upper chest: Visualized upper chest demonstrates no acute finding.

Review of the MIP images confirms the above findings

CTA HEAD FINDINGS

Anterior circulation: Petrous segments patent bilaterally. Mild for
age atheromatous change within the carotid siphons without
significant stenosis. A1 segments, anterior communicating complex
common anterior cerebral arteries patent without stenosis or other
abnormality. No M1 stenosis or occlusion. No proximal MCA branch
occlusion. Distal MCA branches well perfused and symmetric.

Posterior circulation: Both V4 segments widely patent. Left
vertebral artery dominant. Both PICA patent at their origins.
Basilar patent without stenosis. Superior cerebellar arteries patent
bilaterally. Both PCAs primarily supplied via the basilar
atheromatous irregularity seen involving the right greater than left
PCAs bilaterally. Associated multifocal moderate to severe stenoses
seen involving the right P2 segment (series 12, image 56). No
proximal high-grade stenosis about the contralateral left PCA. PCAs
remain grossly patent to their distal aspects.

Venous sinuses: Grossly patent allowing for timing the contrast
bolus.

Anatomic variants: None significant.  No aneurysm.

Review of the MIP images confirms the above findings
IMPRESSION: 1. Negative CTA for large vessel occlusion or other emergent
finding.
2. Extensive bulky calcified plaque about the right carotid
bulb/proximal right ICA with associated severe stenosis of up to 80%
by NASCET criteria.
3. 50% atheromatous stenosis about the left carotid bifurcation.
4. Atheromatous plaque at the origins of both vertebral arteries
with associated severe ostial stenoses.
5. Moderate to advanced atheromatous irregularity involving the
right PCA with associated moderate to severe multifocal right P2
stenoses.

## 2023-06-21 DIAGNOSIS — L84 Corns and callosities: Secondary | ICD-10-CM | POA: Diagnosis not present

## 2023-06-21 DIAGNOSIS — L603 Nail dystrophy: Secondary | ICD-10-CM | POA: Diagnosis not present

## 2023-06-21 DIAGNOSIS — I739 Peripheral vascular disease, unspecified: Secondary | ICD-10-CM | POA: Diagnosis not present

## 2023-08-16 DIAGNOSIS — L84 Corns and callosities: Secondary | ICD-10-CM | POA: Diagnosis not present

## 2023-08-16 DIAGNOSIS — I739 Peripheral vascular disease, unspecified: Secondary | ICD-10-CM | POA: Diagnosis not present

## 2023-08-31 DIAGNOSIS — N390 Urinary tract infection, site not specified: Secondary | ICD-10-CM | POA: Diagnosis not present

## 2023-11-15 DIAGNOSIS — I739 Peripheral vascular disease, unspecified: Secondary | ICD-10-CM | POA: Diagnosis not present

## 2023-11-15 DIAGNOSIS — L603 Nail dystrophy: Secondary | ICD-10-CM | POA: Diagnosis not present

## 2023-11-15 DIAGNOSIS — L84 Corns and callosities: Secondary | ICD-10-CM | POA: Diagnosis not present

## 2023-11-17 DIAGNOSIS — I10 Essential (primary) hypertension: Secondary | ICD-10-CM | POA: Diagnosis not present

## 2023-11-17 DIAGNOSIS — E785 Hyperlipidemia, unspecified: Secondary | ICD-10-CM | POA: Diagnosis not present

## 2023-11-17 DIAGNOSIS — R2681 Unsteadiness on feet: Secondary | ICD-10-CM | POA: Diagnosis not present

## 2023-11-17 DIAGNOSIS — E1169 Type 2 diabetes mellitus with other specified complication: Secondary | ICD-10-CM | POA: Diagnosis not present

## 2023-11-17 DIAGNOSIS — M1990 Unspecified osteoarthritis, unspecified site: Secondary | ICD-10-CM | POA: Diagnosis not present

## 2023-12-21 ENCOUNTER — Other Ambulatory Visit: Payer: Self-pay | Admitting: *Deleted

## 2023-12-21 DIAGNOSIS — I6529 Occlusion and stenosis of unspecified carotid artery: Secondary | ICD-10-CM

## 2023-12-21 DIAGNOSIS — Z9862 Peripheral vascular angioplasty status: Secondary | ICD-10-CM

## 2023-12-29 NOTE — Progress Notes (Unsigned)
 Office Note    HPI: Nathaniel Mcclain is a 88 y.o. (04/18/1936) male presenting in postoperative follow-up status post right-sided transcarotid artery revascularization.  Patient underwent right-sided TCAR for symptomatic ICA stenosis with placement of 10 x 40 mm stent.  Deficit at the time included left upper extremity numbness, the weakness had resolved preoperatively.  On exam today, Nathaniel Mcclain was doing well.   He denied symptoms of TIA, amaurosis, stroke.  No wound healing concerns..  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  His wife continues to struggle with A-fib, and has had 5 failed ablations..  He continues to be compliant with aspirin , high intensity statin   Past Medical History:  Diagnosis Date   Arthritis    Colon polyps    CVA (cerebral vascular accident) (HCC) 12/2021   Diabetes (HCC)    pt denies, and does not take any medications   GERD (gastroesophageal reflux disease)    Hepatitis    hx of Hepatitis A   Hyperlipidemia    Hypertension    Hypothyroid    Osteoarthritis    TIA (transient ischemic attack) 1980    Past Surgical History:  Procedure Laterality Date   THYROIDECTOMY  2008   TRANSCAROTID ARTERY REVASCULARIZATION  Right 12/18/2021   Procedure: Right Transcarotid Artery Revascularization;  Surgeon: Kayla Part, MD;  Location: [facility] OR;  Service: Vascular;  Laterality: Right;    Social History   Socioeconomic History   Marital status: Married    Spouse name: Not on file   Number of children: 1   Years of education: Not on file   Highest education level: Not on file  Occupational History   Not on file  Tobacco Use   Smoking status: Never   Smokeless tobacco: Never  Vaping Use   Vaping status: Never Used  Substance and Sexual Activity   Alcohol  use: No    Alcohol /week: 0.0 standard drinks of alcohol    Drug use: No   Sexual activity: Not on file  Other Topics Concern   Not on file  Social History Narrative   Not on file   Social  Drivers of Health   Financial Resource Strain: Not on file  Food Insecurity: Not on file  Transportation Needs: Not on file  Physical Activity: Not on file  Stress: Not on file  Social Connections: Not on file  Intimate Partner Violence: Not on file   Family History  Problem Relation Age of Onset   Diabetes Mother    Heart disease Mother    Colon cancer Neg Hx    Esophageal cancer Neg Hx    Rectal cancer Neg Hx    Stomach cancer Neg Hx     Current Outpatient Medications  Medication Sig Dispense Refill   amLODipine  (NORVASC ) 10 MG tablet Take 10 mg by mouth daily.     aspirin  81 MG chewable tablet Chew 1 tablet (81 mg total) by mouth daily. 30 tablet 0   benazepril  (LOTENSIN ) 20 MG tablet Take 0.5 tablets (10 mg total) by mouth 2 (two) times daily. (Patient taking differently: Take 20 mg by mouth 2 (two) times daily.)     bisacodyl  (DULCOLAX) 5 MG EC tablet Take 10 mg by mouth every other day.     clopidogrel  (PLAVIX ) 75 MG tablet Take 1 tablet (75 mg total) by mouth daily. 30 tablet 0   ezetimibe  (ZETIA ) 10 MG tablet Take 1 tablet (10 mg total) by mouth daily. 30 tablet 0   levothyroxine  (SYNTHROID )  150 MCG tablet Take 150 mcg by mouth every morning.     Omega-3 Fatty Acids (FISH OIL PO) Take by mouth.     oxybutynin  (DITROPAN -XL) 5 MG 24 hr tablet Take 5 mg by mouth daily.     pantoprazole  (PROTONIX ) 40 MG tablet Take 40 mg by mouth daily.     rosuvastatin  (CRESTOR ) 40 MG tablet Take 1 tablet (40 mg total) by mouth daily. 30 tablet 0   senna (SENOKOT) 8.6 MG TABS tablet Take 1 tablet by mouth.     tamsulosin (FLOMAX) 0.4 MG CAPS capsule Take 0.4 mg by mouth daily.     No current facility-administered medications for this visit.    Allergies  Allergen Reactions   Codeine      Other reaction(s): Unknown   Prednisone     Other reaction(s): hives     REVIEW OF SYSTEMS:  [X]  denotes positive finding, [ ]  denotes negative finding Cardiac  Comments:  Chest pain or chest  pressure:    Shortness of breath upon exertion:    Short of breath when lying flat:    Irregular heart rhythm:        Vascular    Pain in calf, thigh, or hip brought on by ambulation:    Pain in feet at night that wakes you up from your sleep:     Blood clot in your veins:    Leg swelling:         Pulmonary    Oxygen at home:    Productive cough:     Wheezing:         Neurologic    Sudden weakness in arms or legs:     Sudden numbness in arms or legs:     Sudden onset of difficulty speaking or slurred speech:    Temporary loss of vision in one eye:     Problems with dizziness:         Gastrointestinal    Blood in stool:     Vomited blood:         Genitourinary    Burning when urinating:     Blood in urine:        Psychiatric    Major depression:         Hematologic    Bleeding problems:    Problems with blood clotting too easily:        Skin    Rashes or ulcers:        Constitutional    Fever or chills:      PHYSICAL EXAMINATION:  There were no vitals filed for this visit.   General:  WDWN in NAD; vital signs documented above Gait: Not observed HENT: WNL, normocephalic, right neck incision site healed well Pulmonary: normal non-labored breathing  Cardiac: regular HR Abdomen: soft, NT, no masses Skin: without rashes Vascular Exam/Pulses:  Right Left  Radial 2+ (normal) 2+ (normal)  Ulnar 2+ (normal) 2+ (normal)                   Extremities: without ischemic changes, without Gangrene , without cellulitis; without open wounds;  Musculoskeletal: no muscle wasting or atrophy  Neurologic: A&O X 3;  No focal weakness or paresthesias are detected Psychiatric:  The pt has Normal affect.   Non-Invasive Vascular Imaging:   Right Carotid Findings:  +----------+--------+--------+--------+------------------+-------------+           PSV cm/sEDV cm/sStenosisPlaque DescriptionComments        +----------+--------+--------+--------+------------------+-------------+  CCA Prox  77  13                                               +----------+--------+--------+--------+------------------+-------------+  CCA Mid   60      11                                               +----------+--------+--------+--------+------------------+-------------+  CCA Distal59      14              heterogenous      stent inflow   +----------+--------+--------+--------+------------------+-------------+  ICA Prox                                            stent          +----------+--------+--------+--------+------------------+-------------+  ICA Mid   98      19                                stent outflow  +----------+--------+--------+--------+------------------+-------------+  ICA Distal85      18                                               +----------+--------+--------+--------+------------------+-------------+  ECA      350     26      >50%                                     +----------+--------+--------+--------+------------------+-------------+   +----------+--------+-------+----------+-------------------+           PSV cm/sEDV cmsDescribe  Arm Pressure (mmHG)  +----------+--------+-------+----------+-------------------+  [id number]     0      monophasic                     +----------+--------+-------+----------+-------------------+   +---------+--------+--+--------+--+---------+  VertebralPSV cm/s54EDV cm/s12Antegrade  +---------+--------+--+--------+--+---------+   Right Stent(s):  +---------------+---+--++++  Prox to Stent  59 14  +---------------+---+--++++  Proximal Stent 87 17  +---------------+---+--++++  Mid Stent      11223  +---------------+---+--++++  Distal Stent   10421  +---------------+---+--++++  Distal to Stent98 19  +---------------+---+--++++     Left  Carotid Findings:  +----------+--------+--------+--------+------------------+--------+           PSV cm/sEDV cm/sStenosisPlaque DescriptionComments  +----------+--------+--------+--------+------------------+--------+  CCA Prox  97      15                                          +----------+--------+--------+--------+------------------+--------+  CCA Mid   93      14              heterogenous                +----------+--------+--------+--------+------------------+--------+  CCA Distal93      14  heterogenous                +----------+--------+--------+--------+------------------+--------+  ICA Prox  80      23      1-39%   heterogenous                +----------+--------+--------+--------+------------------+--------+  ICA Mid   81      20                                          +----------+--------+--------+--------+------------------+--------+  ICA Distal75      23                                          +----------+--------+--------+--------+------------------+--------+  ECA      92      10                                          +----------+--------+--------+--------+------------------+--------+   +----------+--------+--------+----------------+-------------------+           PSV cm/sEDV cm/sDescribe        Arm Pressure (mmHG)  +----------+--------+--------+----------------+-------------------+  Subclavian127            Multiphasic, WNL                     +----------+--------+--------+----------------+-------------------+   +---------+--------+--+--------+-+---------+  VertebralPSV cm/s18EDV cm/s6Antegrade  +---------+--------+--+--------+-+---------+     ASSESSMENT/PLAN: KIEN MIRSKY is a 88 y.o. male presenting status post 12/18/2021 right-sided TCAR for symptomatic ICA stenosis.  Bilateral carotid duplex ultrasound reviewed demonstrating no stenosis within the stent, mild stenosis in the  contralateral ICA.  I plan to see Nathaniel Mcclain back in the office in 12 months with repeat duplex ultrasound.  I asked that he continue his current medication regimen and call should any questions or concerns arise.   Kayla Part, MD [facility] [phone]

## 2023-12-30 ENCOUNTER — Ambulatory Visit (HOSPITAL_COMMUNITY)
Admission: RE | Admit: 2023-12-30 | Discharge: 2023-12-30 | Disposition: A | Discharge status: Home / Self Care | Source: Ambulatory Visit | Attending: Vascular Surgery | Admitting: Vascular Surgery

## 2023-12-30 ENCOUNTER — Encounter: Payer: Self-pay | Admitting: Vascular Surgery

## 2023-12-30 ENCOUNTER — Ambulatory Visit (INDEPENDENT_AMBULATORY_CARE_PROVIDER_SITE_OTHER): Admitting: Vascular Surgery

## 2023-12-30 VITALS — BP 145/73 | HR 70 | Temp 98.0°F | Resp 18 | Ht 72.0 in | Wt 172.3 lb

## 2023-12-30 DIAGNOSIS — I6529 Occlusion and stenosis of unspecified carotid artery: Secondary | ICD-10-CM | POA: Diagnosis not present

## 2023-12-30 DIAGNOSIS — Z9862 Peripheral vascular angioplasty status: Secondary | ICD-10-CM

## 2024-01-01 ENCOUNTER — Emergency Department (HOSPITAL_COMMUNITY)
Admission: EM | Admit: 2024-01-01 | Discharge: 2024-01-01 | Disposition: A | Discharge status: Home / Self Care | Attending: Emergency Medicine | Admitting: Emergency Medicine

## 2024-01-01 ENCOUNTER — Emergency Department (HOSPITAL_COMMUNITY)

## 2024-01-01 ENCOUNTER — Encounter (HOSPITAL_COMMUNITY): Payer: Self-pay

## 2024-01-01 ENCOUNTER — Other Ambulatory Visit: Payer: Self-pay

## 2024-01-01 DIAGNOSIS — R072 Precordial pain: Secondary | ICD-10-CM | POA: Diagnosis not present

## 2024-01-01 DIAGNOSIS — R0602 Shortness of breath: Secondary | ICD-10-CM | POA: Diagnosis not present

## 2024-01-01 DIAGNOSIS — R0789 Other chest pain: Secondary | ICD-10-CM | POA: Diagnosis present

## 2024-01-01 DIAGNOSIS — R079 Chest pain, unspecified: Secondary | ICD-10-CM | POA: Diagnosis not present

## 2024-01-01 DIAGNOSIS — Z7982 Long term (current) use of aspirin: Secondary | ICD-10-CM | POA: Insufficient documentation

## 2024-01-01 DIAGNOSIS — Z7901 Long term (current) use of anticoagulants: Secondary | ICD-10-CM | POA: Insufficient documentation

## 2024-01-01 DIAGNOSIS — I700 Atherosclerosis of aorta: Secondary | ICD-10-CM | POA: Diagnosis not present

## 2024-01-01 LAB — COMPREHENSIVE METABOLIC PANEL WITH GFR
ALT: 31 U/L (ref 0–44)
AST: 48 U/L — ABNORMAL HIGH (ref 15–41)
Albumin: 4.5 g/dL (ref 3.5–5.0)
Alkaline Phosphatase: 67 U/L (ref 38–126)
Anion gap: 12 (ref 5–15)
BUN: 18 mg/dL (ref 8–23)
CO2: 26 mmol/L (ref 22–32)
Calcium: 9.5 mg/dL (ref 8.9–10.3)
Chloride: 102 mmol/L (ref 98–111)
Creatinine, Ser: 0.96 mg/dL (ref 0.61–1.24)
GFR, Estimated: >60 mL/min (ref >60)
Glucose, Bld: 118 mg/dL — ABNORMAL HIGH (ref 70–99)
Potassium: 4 mmol/L (ref 3.5–5.1)
Sodium: 140 mmol/L (ref 135–145)
Total Bilirubin: 1.1 mg/dL (ref 0.0–1.2)
Total Protein: 8 g/dL (ref 6.5–8.1)

## 2024-01-01 LAB — CBC
HCT: 46.8 % (ref 39.0–52.0)
Hemoglobin: 15.6 g/dL (ref 13.0–17.0)
MCH: 30.4 pg (ref 26.0–34.0)
MCHC: 33.3 g/dL (ref 30.0–36.0)
MCV: 91.2 fL (ref 80.0–100.0)
Platelets: 209 10*3/uL (ref 150–400)
RBC: 5.13 MIL/uL (ref 4.22–5.81)
RDW: 13.8 % (ref 11.5–15.5)
WBC: 8.5 10*3/uL (ref 4.0–10.5)
nRBC: 0 % (ref 0.0–0.2)

## 2024-01-01 LAB — LIPASE, BLOOD: Lipase: 29 U/L (ref 11–51)

## 2024-01-01 LAB — TROPONIN I (HIGH SENSITIVITY)
Troponin I (High Sensitivity): 12 ng/L (ref <18)
Troponin I (High Sensitivity): 13 ng/L (ref <18)

## 2024-01-01 MED ORDER — ALUM & MAG HYDROXIDE-SIMETH 200-200-20 MG/5ML PO SUSP
30.0000 mL | Freq: Once | ORAL | Status: AC
  Administered 2024-01-01: 30 mL via ORAL
  Filled 2024-01-01: qty 30

## 2024-01-01 MED ORDER — FAMOTIDINE 20 MG PO TABS
20.0000 mg | ORAL_TABLET | Freq: Once | ORAL | Status: AC
  Administered 2024-01-01: 20 mg via ORAL
  Filled 2024-01-01: qty 1

## 2024-01-01 MED ORDER — ACETAMINOPHEN 500 MG PO TABS
1000.0000 mg | ORAL_TABLET | Freq: Once | ORAL | Status: AC
  Administered 2024-01-01: 1000 mg via ORAL
  Filled 2024-01-01: qty 2

## 2024-01-01 NOTE — Discharge Instructions (Addendum)
 It was our pleasure to provide your ER care today - we hope that you feel better.  Take acetaminophen  as need.   For recent chest pain, follow up closely with your doctor in the next 1-2 weeks - call office Monday to arrange follow up appointment.   Return to ER right away if worse, new symptoms, fevers, recurrent or persistent chest pain, increased trouble breathing, weak/fainting, or other concern.

## 2024-01-01 NOTE — ED Triage Notes (Signed)
 Pt reports with chest pain all over that started after moving his wife's walker upstairs. Pain 10/10.

## 2024-01-01 NOTE — ED Provider Notes (Signed)
 [facility] Provider Note   CSN: [id number] Arrival date & time: 01/01/24  1543     History  Chief Complaint  Patient presents with   Chest Pain    Nathaniel Mcclain is a 87 y.o. male.  Patient c/o lower/midline chest pain in the past day. Indicates started after moving spouses walker, but unsure if strain or other cause of his chest pain. Denies prior similar chest pain. No exertional chest pain. No pleuritic chest pain. Does appear worse w certain movements/positional changes. No associated sob, nv or diaphoresis. No radiation. No back or flank pain. No neck pain. No cough or uri symptoms. No fever or chills. No heartburn.   The history is provided by the patient, medical records and the spouse.  Chest Pain Associated symptoms: no abdominal pain, no back pain, no cough, no diaphoresis, no fever, no headache, no nausea, no palpitations, no shortness of breath and no vomiting        Home Medications Prior to Admission medications   Medication Sig Start Date End Date Taking? Authorizing Provider  amLODipine  (NORVASC ) 10 MG tablet Take 10 mg by mouth daily. 10/13/21   [provider]  aspirin  81 MG chewable tablet Chew 1 tablet (81 mg total) by mouth daily. 12/11/21   Singh, Prashant K, MD  benazepril  (LOTENSIN ) 20 MG tablet Take 0.5 tablets (10 mg total) by mouth 2 (two) times daily. Patient taking differently: Take 20 mg by mouth 2 (two) times daily. 12/12/21   Cala Castleman, MD  bisacodyl  (DULCOLAX) 5 MG EC tablet Take 10 mg by mouth every other day.    [provider]  clopidogrel  (PLAVIX ) 75 MG tablet Take 1 tablet (75 mg total) by mouth daily. 12/11/21   Singh, Prashant K, MD  ezetimibe  (ZETIA ) 10 MG tablet Take 1 tablet (10 mg total) by mouth daily. 12/11/21 12/11/22  Cala Castleman, MD  levothyroxine  (SYNTHROID ) 150 MCG tablet Take 150 mcg by mouth every morning. 12/08/21   [provider]  Omega-3 Fatty  Acids (FISH OIL PO) Take by mouth.    [provider]  oxybutynin  (DITROPAN -XL) 5 MG 24 hr tablet Take 5 mg by mouth daily. 12/08/21   [provider]  pantoprazole  (PROTONIX ) 40 MG tablet Take 40 mg by mouth daily. 12/08/21   [provider]  rosuvastatin  (CRESTOR ) 40 MG tablet Take 1 tablet (40 mg total) by mouth daily. 12/11/21   Singh, Prashant K, MD  senna (SENOKOT) 8.6 MG TABS tablet Take 1 tablet by mouth.    [provider]  tamsulosin (FLOMAX) 0.4 MG CAPS capsule Take 0.4 mg by mouth daily.    [provider]      Allergies    Codeine  and Prednisone    Review of Systems   Review of Systems  Constitutional:  Negative for chills, diaphoresis and fever.  HENT:  Negative for sore throat.   Respiratory:  Negative for cough and shortness of breath.   Cardiovascular:  Positive for chest pain. Negative for palpitations and leg swelling.  Gastrointestinal:  Negative for abdominal distention, abdominal pain, nausea and vomiting.  Genitourinary:  Negative for dysuria and flank pain.  Musculoskeletal:  Negative for back pain and neck pain.  Skin:  Negative for rash.  Neurological:  Negative for headaches.    Physical Exam Updated Vital Signs BP (!) 141/81   Pulse (!) 56   Temp 98.2 F (36.8 C) (Oral)   Resp 18  SpO2 94%  Physical Exam Vitals and nursing note reviewed.  Constitutional:      Appearance: Normal appearance. He is well-developed.  HENT:     Head: Atraumatic.     Nose: Nose normal.     Mouth/Throat:     Mouth: Mucous membranes are moist.  Eyes:     General: No scleral icterus.    Conjunctiva/sclera: Conjunctivae normal.  Neck:     Trachea: No tracheal deviation.  Cardiovascular:     Rate and Rhythm: Normal rate and regular rhythm.     Pulses: Normal pulses.     Heart sounds: Normal heart sounds. No murmur heard.    No friction rub. No gallop.  Pulmonary:     Effort: Pulmonary effort is normal. No accessory muscle  usage or respiratory distress.     Breath sounds: Normal breath sounds.     Comments: Lower midline chest wall tenderness, reproducing symptoms. Normal chest wall movement. No crepitus.  Chest:     Chest wall: Tenderness present.  Abdominal:     General: Bowel sounds are normal. There is no distension.     Palpations: Abdomen is soft. There is no mass.     Tenderness: There is no abdominal tenderness. There is no guarding.  Genitourinary:    Comments: No cva tenderness. Musculoskeletal:        General: No swelling or tenderness.     Cervical back: Normal range of motion and neck supple. No rigidity or tenderness.     Right lower leg: No edema.     Left lower leg: No edema.  Skin:    General: Skin is warm and dry.     Findings: No rash.  Neurological:     Mental Status: He is alert.     Comments: Alert, speech clear.   Psychiatric:        Mood and Affect: Mood normal.     ED Results / Procedures / Treatments   Labs (all labs ordered are listed, but only abnormal results are displayed) Results for orders placed or performed during the hospital encounter of 01/01/24  CBC   Collection Time: 01/01/24  3:56 PM  Result Value Ref Range   WBC 8.5 4.0 - 10.5 K/uL   RBC 5.13 4.22 - 5.81 MIL/uL   Hemoglobin 15.6 13.0 - 17.0 g/dL   HCT 16.1 09.6 - 04.5 %   MCV 91.2 80.0 - 100.0 fL   MCH 30.4 26.0 - 34.0 pg   MCHC 33.3 30.0 - 36.0 g/dL   RDW 40.9 81.1 - 91.4 %   Platelets 209 150 - 400 K/uL   nRBC 0.0 0.0 - 0.2 %  Comprehensive metabolic panel with GFR   Collection Time: 01/01/24  3:56 PM  Result Value Ref Range   Sodium 140 135 - 145 mmol/L   Potassium 4.0 3.5 - 5.1 mmol/L   Chloride 102 98 - 111 mmol/L   CO2 26 22 - 32 mmol/L   Glucose, Bld 118 (H) 70 - 99 mg/dL   BUN 18 8 - 23 mg/dL   Creatinine, Ser 7.82 0.61 - 1.24 mg/dL   Calcium  9.5 8.9 - 10.3 mg/dL   Total Protein 8.0 6.5 - 8.1 g/dL   Albumin 4.5 3.5 - 5.0 g/dL   AST 48 (H) 15 - 41 U/L   ALT 31 0 - 44 U/L    Alkaline Phosphatase 67 38 - 126 U/L   Total Bilirubin 1.1 0.0 - 1.2 mg/dL   GFR, Estimated >95 >  60 mL/min   Anion gap 12 5 - 15  Lipase, blood   Collection Time: 01/01/24  3:56 PM  Result Value Ref Range   Lipase 29 11 - 51 U/L  Troponin I (High Sensitivity)   Collection Time: 01/01/24  3:56 PM  Result Value Ref Range   Troponin I (High Sensitivity) 12 <18 ng/L  Troponin I (High Sensitivity)   Collection Time: 01/01/24  7:03 PM  Result Value Ref Range   Troponin I (High Sensitivity) 13 <18 ng/L   DG Chest 2 View Result Date: 01/01/2024 CLINICAL DATA:  Chest pain and shortness of breath. EXAM: CHEST - 2 VIEW COMPARISON:  None Available. FINDINGS: The heart size and mediastinal contours are within normal limits. There is atherosclerotic calcification of the aorta. No consolidation, effusion, or pneumothorax is seen. Degenerative changes are present in the thoracic spine. Surgical clips are noted over the superior mediastinum. IMPRESSION: No active cardiopulmonary disease. Electronically Signed   By: Wyvonnia Heimlich M.D.   On: 01/01/2024 17:03   VAS US  CAROTID Result Date: 12/30/2023 Carotid Arterial Duplex Study Patient Name:  DAUNE COLGATE Balding  Date of Exam:   12/30/2023 Medical Rec #: [id number]    Accession #:    [id number] Date of Birth: [birth year]    Patient Gender: M Patient Age:   39 years Exam Location:  [address] Procedure:      VAS US  CAROTID Referring Phys: Melodie Spry ROBINS --------------------------------------------------------------------------------  Indications:       Carotid artery disease, Right stent and Patient denies any                    cerebrovascular symptoms. Risk Factors:      Hypertension, hyperlipidemia, prior CVA. Other Factors:     12/18/2021 Right TCAR w/ 10 x 40mm stent. Comparison Study:  Prior carotid duplex exam on 12/04/2022 showed highest                    velocities in right ICA stent at 112 cm/s and left proximal                    ICA 80/23 cm/s Performing  Technologist: Richarda Chance RVT, RDCS (AE), RDMS  Examination Guidelines: A complete evaluation includes B-mode imaging, spectral Doppler, color Doppler, and power Doppler as needed of all accessible portions of each vessel. Bilateral testing is considered an integral part of a complete examination. Limited examinations for reoccurring indications may be performed as noted.  Right Carotid Findings: +----------+-------+-------+--------+------------------------+-----------------+           PSV    EDV    StenosisPlaque Description      Comments                    cm/s   cm/s                                                     +----------+-------+-------+--------+------------------------+-----------------+ CCA Prox  85     12                                     intimal  thickening        +----------+-------+-------+--------+------------------------+-----------------+ CCA Mid   72     14                                     intimal                                                                   thickening        +----------+-------+-------+--------+------------------------+-----------------+ CCA Distal73     16             focal, calcific and     intimal                                           smooth                  thickening        +----------+-------+-------+--------+------------------------+-----------------+ ICA Mid   71     17                                                       +----------+-------+-------+--------+------------------------+-----------------+ ICA Distal59     17                                                       +----------+-------+-------+--------+------------------------+-----------------+ ECA       380    48     >50%    focal and calcific                        +----------+-------+-------+--------+------------------------+-----------------+  +----------+--------+-------+----------------+-------------------+           PSV cm/sEDV cmsDescribe        Arm Pressure (mmHG) +----------+--------+-------+----------------+-------------------+ [id number]             Multiphasic, WNL                    +----------+--------+-------+----------------+-------------------+ +---------+--------+--+--------+--+---------+ VertebralPSV cm/s57EDV cm/s12Antegrade +---------+--------+--+--------+--+---------+  Right Stent(s): +---------------+--------+--------+--------+--------+--------+ Prox-Mid ICA   PSV cm/sEDV cm/sStenosisWaveformComments +---------------+--------+--------+--------+--------+--------+ Prox to Stent  65      13                               +---------------+--------+--------+--------+--------+--------+ Proximal Stent 94      14                               +---------------+--------+--------+--------+--------+--------+ Mid Stent      77      12                               +---------------+--------+--------+--------+--------+--------+  Distal Stent   111     21                               +---------------+--------+--------+--------+--------+--------+ Distal to Stent82      19                               +---------------+--------+--------+--------+--------+--------+ Patent ICA stent  Left Carotid Findings: +----------+-------+-------+--------+------------------------+-----------------+           PSV    EDV    StenosisPlaque Description      Comments                    cm/s   cm/s                                                     +----------+-------+-------+--------+------------------------+-----------------+ CCA Prox  111    17                                                       +----------+-------+-------+--------+------------------------+-----------------+ CCA Mid   108    21                                     intimal                                                                    thickening        +----------+-------+-------+--------+------------------------+-----------------+ CCA Distal64     15             focal, calcific and     intimal                                           smooth                  thickening        +----------+-------+-------+--------+------------------------+-----------------+ ICA Prox  68     13             diffuse and calcific    intimal                                                                   thickening        +----------+-------+-------+--------+------------------------+-----------------+ ICA Mid   82     24                                                       +----------+-------+-------+--------+------------------------+-----------------+  ICA Distal72     21                                                       +----------+-------+-------+--------+------------------------+-----------------+ ECA       94     12                                                       +----------+-------+-------+--------+------------------------+-----------------+ +----------+--------+--------+----------------+-------------------+           PSV cm/sEDV cm/sDescribe        Arm Pressure (mmHG) +----------+--------+--------+----------------+-------------------+ [id number]             Multiphasic, WNL                    +----------+--------+--------+----------------+-------------------+ +---------+--------+--+--------+--+---------+ VertebralPSV cm/s26EDV cm/s10Antegrade +---------+--------+--+--------+--+---------+   Summary: Right Carotid: The ECA appears >50% stenosed. Patent ICA stent without stenosis. Left Carotid: The extracranial vessels were near-normal with only minimal wall               thickening or plaque. Vertebrals:  Bilateral vertebral arteries demonstrate antegrade flow. Subclavians: Normal flow hemodynamics were seen in bilateral subclavian               arteries. *See table(s) above for measurements and observations.  Electronically signed by Irvin Mantel on 12/30/2023 at 4:05:58 PM.    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 EXAM: CHEST - 2 VIEW COMPARISON:  None Available. FINDINGS: The heart size and mediastinal contours are within normal limits. There is atherosclerotic calcification of the aorta. No consolidation, effusion, or pneumothorax is seen. Degenerative changes are present in the thoracic spine. Surgical clips are noted over the superior mediastinum. IMPRESSION: No active cardiopulmonary disease. Electronically Signed   By: Wyvonnia Heimlich M.D.   On: 01/01/2024 17:03    Procedures Procedures    Medications Ordered in ED Medications  alum & mag hydroxide-simeth (MAALOX/MYLANTA) 200-200-20 MG/5ML suspension 30 mL (30 mLs Oral Given 01/01/24 1658)  famotidine (PEPCID) tablet 20 mg (20 mg Oral Given 01/01/24 1657)  acetaminophen  (TYLENOL ) tablet 1,000 mg (1,000 mg Oral Given 01/01/24 1657)    ED Course/ Medical Decision Making/ A&P                                 Medical Decision Making Problems Addressed: Chest wall pain: acute illness or injury Precordial chest pain: acute illness or injury with systemic symptoms that poses a threat to life or bodily functions  Amount and/or Complexity of Data Reviewed Independent Historian: spouse    Details: hx External Data Reviewed: notes. Labs: ordered. Decision-making details documented in ED Course. Radiology: ordered and independent interpretation  performed.  Decision-making details documented in ED Course. ECG/medicine tests: ordered and independent interpretation performed. Decision-making details documented in ED Course.  Risk OTC drugs. Decision regarding hospitalization.   Iv ns. Continuous pulse ox and cardiac monitoring. Labs ordered/sent. Imaging ordered.   Differential diagnosis includes acs, msk cp, gi cp, etc. Dispo decision including potential need for admission considered - will get labs and imaging and reassess.   Reviewed nursing notes and prior charts for additional history. External reports reviewed. Additional history from: spouse.   Meds given po for symptom relief.   Cardiac monitor: sinus rhythm, rate 90.  Labs reviewed/interpreted by me - wbc and hgb normal. Initial and delta trop normal and not significantly increasing - felt not c/w acs.   Xrays reviewed/interpreted by me - no pna or ptx.   Recheck pt comfortable. No chest pain or sob (will have few seconds episode pain w certain movement).   Pt currently appears stable for ED d/c.   Rec close pcp f/u.  Return precautions provided.          Final Clinical Impression(s) / ED Diagnoses Final diagnoses:  None    Rx / DC Orders ED Discharge Orders     None         Guadalupe Lee, MD 01/01/24 [phone]

## 2024-01-01 NOTE — ED Notes (Addendum)
 Pt has periods of bradycardiac as low as 56bpm, HR fluctuates from 56-64bpm.

## 2024-02-14 ENCOUNTER — Encounter: Payer: Self-pay | Admitting: Podiatry

## 2024-02-14 ENCOUNTER — Ambulatory Visit (INDEPENDENT_AMBULATORY_CARE_PROVIDER_SITE_OTHER): Admitting: Podiatry

## 2024-02-14 DIAGNOSIS — M79672 Pain in left foot: Secondary | ICD-10-CM | POA: Diagnosis not present

## 2024-02-14 DIAGNOSIS — M79671 Pain in right foot: Secondary | ICD-10-CM

## 2024-02-14 DIAGNOSIS — B351 Tinea unguium: Secondary | ICD-10-CM | POA: Diagnosis not present

## 2024-02-14 NOTE — Progress Notes (Signed)
 Patient presents for evaluation and treatment of tenderness and some redness around nails feet.  Tenderness around toes with walking and wearing shoes.  Physical exam:  General appearance: Alert, pleasant, and in no acute distress.  Vascular: Pedal pulses: DP 2/4 B/L, PT 0/4 B/L.  Moderate edema lower legs bilaterally  Neurological:    Dermatologic:  Nails thickened, disfigured, discolored 1-5 BL with subungual debris.  Redness and hypertrophic nail folds along nail folds bilaterally but no signs of drainage or infection.  Musculoskeletal:     Diagnosis: 1. Painful onychomycotic nails 1 through 5 bilaterally. 2. Pain toes 1 through 5 bilaterally.  Plan: Debrided onychomycotic nails 1 through 5 bilaterally.  Return 3 months

## 2024-03-03 DIAGNOSIS — L570 Actinic keratosis: Secondary | ICD-10-CM | POA: Diagnosis not present

## 2024-03-03 DIAGNOSIS — Z85828 Personal history of other malignant neoplasm of skin: Secondary | ICD-10-CM | POA: Diagnosis not present

## 2024-03-03 DIAGNOSIS — C4441 Basal cell carcinoma of skin of scalp and neck: Secondary | ICD-10-CM | POA: Diagnosis not present

## 2024-03-03 DIAGNOSIS — C44319 Basal cell carcinoma of skin of other parts of face: Secondary | ICD-10-CM | POA: Diagnosis not present

## 2024-03-03 DIAGNOSIS — L821 Other seborrheic keratosis: Secondary | ICD-10-CM | POA: Diagnosis not present

## 2024-03-03 DIAGNOSIS — L814 Other melanin hyperpigmentation: Secondary | ICD-10-CM | POA: Diagnosis not present

## 2024-04-17 DIAGNOSIS — C4441 Basal cell carcinoma of skin of scalp and neck: Secondary | ICD-10-CM | POA: Diagnosis not present

## 2024-04-17 DIAGNOSIS — C44319 Basal cell carcinoma of skin of other parts of face: Secondary | ICD-10-CM | POA: Diagnosis not present

## 2024-04-17 DIAGNOSIS — Z85828 Personal history of other malignant neoplasm of skin: Secondary | ICD-10-CM | POA: Diagnosis not present

## 2024-04-27 DIAGNOSIS — L0889 Other specified local infections of the skin and subcutaneous tissue: Secondary | ICD-10-CM | POA: Diagnosis not present

## 2024-05-15 DIAGNOSIS — E1169 Type 2 diabetes mellitus with other specified complication: Secondary | ICD-10-CM | POA: Diagnosis not present

## 2024-05-15 DIAGNOSIS — I639 Cerebral infarction, unspecified: Secondary | ICD-10-CM | POA: Diagnosis not present

## 2024-05-15 DIAGNOSIS — Z23 Encounter for immunization: Secondary | ICD-10-CM | POA: Diagnosis not present

## 2024-05-15 DIAGNOSIS — I10 Essential (primary) hypertension: Secondary | ICD-10-CM | POA: Diagnosis not present

## 2024-05-15 DIAGNOSIS — Z0000 Encounter for general adult medical examination without abnormal findings: Secondary | ICD-10-CM | POA: Diagnosis not present

## 2024-05-15 DIAGNOSIS — E785 Hyperlipidemia, unspecified: Secondary | ICD-10-CM | POA: Diagnosis not present

## 2024-05-16 ENCOUNTER — Ambulatory Visit: Admitting: Podiatry

## 2024-05-16 DIAGNOSIS — M79672 Pain in left foot: Secondary | ICD-10-CM | POA: Diagnosis not present

## 2024-05-16 DIAGNOSIS — B351 Tinea unguium: Secondary | ICD-10-CM | POA: Diagnosis not present

## 2024-05-16 DIAGNOSIS — M79671 Pain in right foot: Secondary | ICD-10-CM | POA: Diagnosis not present

## 2024-05-16 NOTE — Progress Notes (Signed)
 Patient presents for evaluation and treatment of tenderness and some redness around nails feet.  Tenderness around toes with walking and wearing shoes.  Physical exam:  General appearance: Alert, pleasant, and in no acute distress.  Vascular: Pedal pulses: DP 2/4 B/L, PT 0/4 B/L. Moderate edema lower legs bilaterally  Neu  Dermatologic:  Nails thickened, disfigured, discolored 1-5 BL with subungual debris.  Redness and hypertrophic nail folds along nail folds bilaterally but no signs of drainage or infection.  Musculoskeletal:     Diagnosis: 1. Painful onychomycotic nails 1 through 5 bilaterally. 2. Pain toes 1 through 5 bilaterally.  Plan: -Debrided onychomycotic nails 1 through 5 bilaterally.  Sharply debrided nails with nail clipper and reduced with a power bur.  Return 3 months [facility]

## 2024-07-13 DIAGNOSIS — L570 Actinic keratosis: Secondary | ICD-10-CM | POA: Diagnosis not present

## 2024-07-13 DIAGNOSIS — Z85828 Personal history of other malignant neoplasm of skin: Secondary | ICD-10-CM | POA: Diagnosis not present

## 2024-07-13 DIAGNOSIS — L821 Other seborrheic keratosis: Secondary | ICD-10-CM | POA: Diagnosis not present

## 2024-07-13 DIAGNOSIS — L814 Other melanin hyperpigmentation: Secondary | ICD-10-CM | POA: Diagnosis not present

## 2024-08-17 ENCOUNTER — Ambulatory Visit: Admitting: Podiatry

## 2024-08-17 DIAGNOSIS — M79674 Pain in right toe(s): Secondary | ICD-10-CM

## 2024-08-17 DIAGNOSIS — M79671 Pain in right foot: Secondary | ICD-10-CM

## 2024-08-17 DIAGNOSIS — B351 Tinea unguium: Secondary | ICD-10-CM

## 2024-08-17 DIAGNOSIS — M79675 Pain in left toe(s): Secondary | ICD-10-CM

## 2024-08-17 NOTE — Progress Notes (Signed)
 Patient presents for evaluation and treatment of tenderness and some redness around nails feet.  Tenderness around toes with walking and wearing shoes.  Physical exam:  General appearance: Alert, pleasant, and in no acute distress.  Vascular: Pedal pulses: DP 2/4 B/L, PT 0/4 B/L.  There edema lower legs bilaterally.  Capillary refill time immediate bilaterally  Neurologic:  Dermatologic:  Nails thickened, disfigured, discolored 1-5 BL with subungual debris.  Redness and hypertrophic nail folds along nail folds bilaterally but no signs of drainage or infection.  Musculoskeletal:     Diagnosis: 1. Painful onychomycotic nails 1 through 5 bilaterally. 2. Pain toes 1 through 5 bilaterally.  Plan: -Debrided onychomycotic nails 1 through 5 bilaterally.  Sharply debrided nails with nail clipper and reduced with a power bur.  Return 3 months RFC

## 2024-10-26 ENCOUNTER — Ambulatory Visit: Admitting: Podiatry
